# Patient Record
Sex: Male | Born: 1995 | Race: Black or African American | Hispanic: No | Marital: Single | Smoking: Current some day smoker
Health system: Southern US, Community
[De-identification: ages and names within clinical notes are randomized; demographics above are authoritative.]

## PROBLEM LIST (undated history)

## (undated) ENCOUNTER — Ambulatory Visit (HOSPITAL_COMMUNITY): Admission: EM | Payer: Self-pay | Source: Home / Self Care

## (undated) DIAGNOSIS — H539 Unspecified visual disturbance: Secondary | ICD-10-CM

## (undated) DIAGNOSIS — J45909 Unspecified asthma, uncomplicated: Secondary | ICD-10-CM

## (undated) DIAGNOSIS — F909 Attention-deficit hyperactivity disorder, unspecified type: Secondary | ICD-10-CM

## (undated) DIAGNOSIS — K589 Irritable bowel syndrome without diarrhea: Secondary | ICD-10-CM

## (undated) DIAGNOSIS — F4310 Post-traumatic stress disorder, unspecified: Secondary | ICD-10-CM

## (undated) DIAGNOSIS — Z7289 Other problems related to lifestyle: Secondary | ICD-10-CM

## (undated) DIAGNOSIS — F419 Anxiety disorder, unspecified: Secondary | ICD-10-CM

## (undated) DIAGNOSIS — F32A Depression, unspecified: Secondary | ICD-10-CM

## (undated) DIAGNOSIS — T1491XA Suicide attempt, initial encounter: Secondary | ICD-10-CM

## (undated) DIAGNOSIS — K5900 Constipation, unspecified: Secondary | ICD-10-CM

## (undated) DIAGNOSIS — F329 Major depressive disorder, single episode, unspecified: Secondary | ICD-10-CM

## (undated) HISTORY — DX: Constipation, unspecified: K59.00

## (undated) HISTORY — DX: Irritable bowel syndrome, unspecified: K58.9

---
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
## 2018-08-30 ENCOUNTER — Encounter (HOSPITAL_COMMUNITY): Payer: Self-pay | Admitting: Emergency Medicine

## 2018-08-30 ENCOUNTER — Ambulatory Visit (INDEPENDENT_AMBULATORY_CARE_PROVIDER_SITE_OTHER): Payer: Medicaid Other

## 2018-08-30 ENCOUNTER — Ambulatory Visit (HOSPITAL_COMMUNITY)
Admission: EM | Admit: 2018-08-30 | Discharge: 2018-08-30 | Disposition: A | Discharge status: Home / Self Care | Payer: Medicaid Other | Attending: Family Medicine | Admitting: Family Medicine

## 2018-08-30 DIAGNOSIS — Z79899 Other long term (current) drug therapy: Secondary | ICD-10-CM | POA: Diagnosis not present

## 2018-08-30 DIAGNOSIS — N4889 Other specified disorders of penis: Secondary | ICD-10-CM | POA: Diagnosis not present

## 2018-08-30 DIAGNOSIS — F909 Attention-deficit hyperactivity disorder, unspecified type: Secondary | ICD-10-CM | POA: Insufficient documentation

## 2018-08-30 DIAGNOSIS — R0789 Other chest pain: Secondary | ICD-10-CM

## 2018-08-30 DIAGNOSIS — S060X0A Concussion without loss of consciousness, initial encounter: Secondary | ICD-10-CM

## 2018-08-30 DIAGNOSIS — W19XXXA Unspecified fall, initial encounter: Secondary | ICD-10-CM | POA: Insufficient documentation

## 2018-08-30 DIAGNOSIS — F4310 Post-traumatic stress disorder, unspecified: Secondary | ICD-10-CM | POA: Diagnosis not present

## 2018-08-30 DIAGNOSIS — R51 Headache: Secondary | ICD-10-CM | POA: Diagnosis present

## 2018-08-30 LAB — POCT URINALYSIS DIP (DEVICE)
BILIRUBIN URINE: NEGATIVE
GLUCOSE, UA: NEGATIVE mg/dL
HGB URINE DIPSTICK: NEGATIVE
Ketones, ur: NEGATIVE mg/dL
LEUKOCYTES UA: NEGATIVE
NITRITE: NEGATIVE
Protein, ur: NEGATIVE mg/dL
Specific Gravity, Urine: 1.02 (ref 1.005–1.030)
UROBILINOGEN UA: 0.2 mg/dL (ref 0.0–1.0)
pH: 7 (ref 5.0–8.0)

## 2018-08-30 NOTE — Discharge Instructions (Signed)
Chest xray was negative. Your urine did not have blood in it. STD testing sent, you will be contacted with any positive results. You can take tylenol/motrin for pain/headache. Follow up with concussion clinic if symptoms not improving. If experiencing worsening of symptoms, headache/blurry vision, nausea/vomiting, confusion/altered mental status, dizziness, weakness, passing out, imbalance, go to the emergency department for further evaluation.

## 2018-08-30 NOTE — ED Notes (Signed)
Patient cannot void at this time 

## 2018-08-30 NOTE — ED Notes (Signed)
Urine placed in lab 
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
## 2018-08-31 LAB — URINE CYTOLOGY ANCILLARY ONLY
CHLAMYDIA, DNA PROBE: NEGATIVE
Neisseria Gonorrhea: NEGATIVE
Trichomonas: NEGATIVE

## 2018-10-11 ENCOUNTER — Ambulatory Visit (HOSPITAL_COMMUNITY)
Admission: EM | Admit: 2018-10-11 | Discharge: 2018-10-11 | Discharge status: Absent without leave | Payer: Medicaid Other

## 2018-11-01 ENCOUNTER — Ambulatory Visit (HOSPITAL_COMMUNITY)
Admission: EM | Admit: 2018-11-01 | Discharge: 2018-11-01 | Disposition: A | Discharge status: Home / Self Care | Payer: Medicaid Other | Attending: Family Medicine | Admitting: Family Medicine

## 2018-11-01 ENCOUNTER — Encounter (HOSPITAL_COMMUNITY): Payer: Self-pay

## 2018-11-01 DIAGNOSIS — F332 Major depressive disorder, recurrent severe without psychotic features: Secondary | ICD-10-CM | POA: Diagnosis not present

## 2018-11-01 DIAGNOSIS — K581 Irritable bowel syndrome with constipation: Secondary | ICD-10-CM | POA: Insufficient documentation

## 2018-11-01 DIAGNOSIS — Z7982 Long term (current) use of aspirin: Secondary | ICD-10-CM | POA: Insufficient documentation

## 2018-11-01 DIAGNOSIS — J45909 Unspecified asthma, uncomplicated: Secondary | ICD-10-CM | POA: Diagnosis not present

## 2018-11-01 DIAGNOSIS — Z79899 Other long term (current) drug therapy: Secondary | ICD-10-CM | POA: Insufficient documentation

## 2018-11-01 DIAGNOSIS — N4889 Other specified disorders of penis: Secondary | ICD-10-CM | POA: Insufficient documentation

## 2018-11-01 DIAGNOSIS — F4310 Post-traumatic stress disorder, unspecified: Secondary | ICD-10-CM | POA: Insufficient documentation

## 2018-11-01 LAB — POCT URINALYSIS DIP (DEVICE)
Bilirubin Urine: NEGATIVE
Glucose, UA: NEGATIVE mg/dL
HGB URINE DIPSTICK: NEGATIVE
KETONES UR: NEGATIVE mg/dL
LEUKOCYTES UA: NEGATIVE
Nitrite: NEGATIVE
Protein, ur: NEGATIVE mg/dL
SPECIFIC GRAVITY, URINE: 1.02 (ref 1.005–1.030)
UROBILINOGEN UA: 0.2 mg/dL (ref 0.0–1.0)
pH: 6.5 (ref 5.0–8.0)

## 2018-11-01 LAB — RAPID HIV SCREEN (HIV 1/2 AB+AG)
HIV 1/2 ANTIBODIES: NONREACTIVE
HIV-1 P24 ANTIGEN - HIV24: NONREACTIVE

## 2018-11-01 NOTE — Discharge Instructions (Signed)
We are testing you today for STDs We will call you with any positive results If your test results come back negative I would like for you to follow-up with urology for further evaluation.

## 2018-11-01 NOTE — ED Triage Notes (Signed)
Pt presents with rash on penis area, pain and discomfort.
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
## 2018-11-02 LAB — RPR: RPR Ser Ql: NONREACTIVE

## 2018-11-02 LAB — URINE CYTOLOGY ANCILLARY ONLY
CHLAMYDIA, DNA PROBE: NEGATIVE
Neisseria Gonorrhea: NEGATIVE
Trichomonas: NEGATIVE

## 2018-11-02 LAB — HSV(HERPES SIMPLEX VRS) I + II AB-IGM: HSVI/II Comb IgM: <0.91 Ratio (ref 0.00–0.90)

## 2019-01-29 ENCOUNTER — Emergency Department (HOSPITAL_COMMUNITY): Payer: Medicaid Other

## 2019-01-29 ENCOUNTER — Emergency Department (HOSPITAL_COMMUNITY)
Admission: EM | Admit: 2019-01-29 | Discharge: 2019-01-29 | Disposition: A | Discharge status: Home / Self Care | Payer: Medicaid Other | Attending: Emergency Medicine | Admitting: Emergency Medicine

## 2019-01-29 ENCOUNTER — Encounter (HOSPITAL_COMMUNITY): Payer: Self-pay

## 2019-01-29 DIAGNOSIS — J45909 Unspecified asthma, uncomplicated: Secondary | ICD-10-CM | POA: Insufficient documentation

## 2019-01-29 DIAGNOSIS — F909 Attention-deficit hyperactivity disorder, unspecified type: Secondary | ICD-10-CM | POA: Diagnosis not present

## 2019-01-29 DIAGNOSIS — Z7982 Long term (current) use of aspirin: Secondary | ICD-10-CM | POA: Insufficient documentation

## 2019-01-29 DIAGNOSIS — R0789 Other chest pain: Secondary | ICD-10-CM | POA: Diagnosis not present

## 2019-01-29 DIAGNOSIS — Z79899 Other long term (current) drug therapy: Secondary | ICD-10-CM | POA: Diagnosis not present

## 2019-01-29 LAB — POCT I-STAT EG7
ACID-BASE EXCESS: 1 mmol/L (ref 0.0–2.0)
Bicarbonate: 27.7 mmol/L (ref 20.0–28.0)
CALCIUM ION: 1.25 mmol/L (ref 1.15–1.40)
HCT: 44 % (ref 39.0–52.0)
HEMOGLOBIN: 15 g/dL (ref 13.0–17.0)
O2 SAT: 66 %
PH VEN: 7.357 (ref 7.250–7.430)
POTASSIUM: 3.9 mmol/L (ref 3.5–5.1)
SODIUM: 141 mmol/L (ref 135–145)
TCO2: 29 mmol/L (ref 22–32)
pCO2, Ven: 49.5 mmHg (ref 44.0–60.0)
pO2, Ven: 36 mmHg (ref 32.0–45.0)

## 2019-01-29 LAB — CBG MONITORING, ED: Glucose-Capillary: 85 mg/dL (ref 70–99)

## 2019-01-29 LAB — I-STAT CREATININE, ED: Creatinine, Ser: 1.3 mg/dL — ABNORMAL HIGH (ref 0.61–1.24)

## 2019-01-29 MED ORDER — IBUPROFEN 800 MG PO TABS
800.0000 mg | ORAL_TABLET | Freq: Once | ORAL | Status: AC
  Administered 2019-01-29: 800 mg via ORAL
  Filled 2019-01-29: qty 1

## 2019-01-29 NOTE — ED Provider Notes (Signed)
Care assumed from Dr. Rosalia Hammers at shift change, please see her note for full details but in brief [name] is a 23 y.o. male presents to the emergency department for evaluation of 1 week of intermittent chest pains they are sharp and located over the anterior chest they last a few seconds and are reproducible on exam.  EKG without concerning changes from previous care team and patient with normal vitals appears to be in no acute distress with clear lungs.  At shift change chest x-ray and basic lab work pending, if this is normal patient can go home with symptomatic treatment for chest wall pain.  Plan: Follow-up on chest x-ray and i-STAT labs to ensure patient has a normal hemoglobin, if no acute findings patient can be discharged home with symptomatic treatment for chest wall pain and PCP follow-up.  Labs Reviewed  I-STAT CREATININE, ED - Abnormal; Notable for the following components:      Result Value   Creatinine, Ser 1.30 (*)    All other components within normal limits  CBG MONITORING, ED  POCT I-STAT EG7   Dg Chest 2 View  Result Date: 01/29/2019 CLINICAL DATA:  Chest pain for a month. EXAM: CHEST - 2 VIEW COMPARISON:  August 30, 2018 FINDINGS: The heart size and mediastinal contours are within normal limits. Both lungs are clear. The visualized skeletal structures are unremarkable. IMPRESSION: No active cardiopulmonary disease. Electronically Signed   By: Gerome Sam III M.D   On: 01/29/2019 18:56   MDM  Chest x-ray shows no evidence of active cardiopulmonary disease.  EKG with normal sinus rhythm.  Labs show a normal hemoglobin, no other acute electrolyte derangements, patient's creatinine is slightly elevated at 1.3, has been around the same previously, will have patient increase p.o. fluid intake and follow-up with his primary care doctor in 1 week for recheck, I have counseled for him to avoid NSAIDs and take Tylenol as needed for chest wall pain.  Return precautions  discussed.  Patient expresses understanding and agreement with plan.  Discharged home in good condition.  Final diagnoses:  Atypical chest pain      Legrand Rams 01/29/19 1954    Margarita Grizzle, MD 02/02/19 [phone]

## 2019-01-29 NOTE — ED Triage Notes (Signed)
Pt arrives to ED from home with complaints of chest pain x1 month, worse with ambulation. EMS reports pt regularly smokes marijuana. Pt placed in position of comfort with bed locked and lowered, call bell in reach.

## 2019-01-29 NOTE — ED Provider Notes (Signed)
[name] EMERGENCY DEPARTMENT Provider Note   CSN: [id number] Arrival date & time: 01/29/19  1829     History   Chief Complaint Chief Complaint  Patient presents with  . Chest Pain    HPI [name] [name] is a 23 y.o. male.  HPI  23 yo male presents via EMS with complaints of intermittent chest pain for 1 week.  Pain is moderate to severe and occurs in the left anterior chest area.  Sharp in nature and lasts minutes to seconds.  He is unable to tell me anything specifically that makes it worse or eases it off.  He has not taken any medications.  He denies any previous evaluation for this.  He states that he has had some cough and intermittent dyspnea but is not currently dyspneic.  He denies any lateralized leg swelling, history of DVT or PE.  He has not had fever or chills or productive cough.  Denies any nausea, vomiting, upper abdominal pain.  Past Medical History:  Diagnosis Date  . ADHD (attention deficit hyperactivity disorder)   . Anxiety   . Asthma   . Constipation   . Deliberate self-cutting   . Depression   . IBS (irritable bowel syndrome)   . Irritable bowel syndrome   . Post traumatic stress disorder (PTSD)   . Suicide attempt (HCC)   . Vision abnormalities    Pt states he wears glasses    Patient Active Problem List   Diagnosis Date Noted  . Severe episode of recurrent major depressive disorder, without psychotic features (HCC)   . Major depression 04/06/2016  . MDD (major depressive disorder), recurrent episode, severe (HCC) 11/14/2015  . Suicidal ideation 09/27/2015  . Substance abuse (HCC) 09/27/2015  . MDD (major depressive disorder), recurrent severe, without psychosis (HCC) 02/01/2015  . PTSD (post-traumatic stress disorder) 11/28/2013  . ADHD (attention deficit hyperactivity disorder), combined type 10/16/2013  . ODD (oppositional defiant disorder) 10/16/2013  . Chronic constipation     History reviewed. No pertinent  surgical history.      Home Medications    Prior to Admission medications   Medication Sig Start Date End Date Taking? Authorizing Provider  amphetamine-dextroamphetamine (ADDERALL XR) 30 MG 24 hr capsule Take 1 capsule (30 mg total) by mouth daily. 05/21/16   Thedora Hinders, MD  Aspirin-Acetaminophen-Caffeine (EXCEDRIN EXTRA STRENGTH PO) Take 2 tablets by mouth daily as needed (headache).    [provider]  famotidine (PEPCID) 20 MG tablet Take 20 mg by mouth daily. 03/26/16   [provider]  hydrOXYzine (ATARAX/VISTARIL) 50 MG tablet Take 1 tablet (50 mg total) by mouth at bedtime. 05/21/16   Thedora Hinders, MD  lithium carbonate (ESKALITH) 450 MG CR tablet Take 2 tablets (900 mg total) by mouth every 12 (twelve) hours. 05/21/16   Thedora Hinders, MD  polyethylene glycol powder (GLYCOLAX/MIRALAX) powder Take 13.5 g by mouth daily. 13.5 gram = 3/4 capful 05/02/13 05/02/14  Jon Gills, MD  Psyllium 30.9 % POWD Take 1 scoop by mouth 2 (two) times daily. 03/29/13   Jon Gills, MD  saccharomyces boulardii (FLORASTOR) 250 MG capsule Take 1 capsule (250 mg total) by mouth 2 (two) times daily. 05/21/16   Thedora Hinders, MD  sertraline (ZOLOFT) 100 MG tablet Take 1 tablet (100 mg total) by mouth daily. 05/21/16   Thedora Hinders, MD    Family History Family History  Problem Relation Age of Onset  . ADD / ADHD  Brother   . Hirschsprung's disease Neg Hx     Social History Social History   Tobacco Use  . Smoking status: Never Smoker  Substance Use Topics  . Alcohol use: Yes    Comment: Pt states he drinks on occassion  . Drug use: No    Types: Oxycodone    Comment: last used in 7th grade when he "tried it"/used oxycodone last 2015     Allergies   Patient has no known allergies.   Review of Systems Review of Systems  All other systems reviewed and are negative.    Physical Exam Updated Vital  Signs Ht 1.651 m (5\' 5" )   Wt 93 kg   BMI 34.12 kg/m   Physical Exam Vitals signs and nursing note reviewed.  Constitutional:      Appearance: He is well-developed.  HENT:     Head: Normocephalic and atraumatic.     Right Ear: External ear normal.     Left Ear: External ear normal.     Nose: Nose normal.  Eyes:     Pupils: Pupils are equal, round, and reactive to light.  Neck:     Musculoskeletal: Normal range of motion.     Trachea: No tracheal deviation.  Cardiovascular:     Rate and Rhythm: Normal rate and regular rhythm.     Heart sounds: Normal heart sounds.  Pulmonary:     Effort: Pulmonary effort is normal.  Chest:     Chest wall: No mass or deformity.    Musculoskeletal: Normal range of motion.  Skin:    General: Skin is warm and dry.  Neurological:     Mental Status: He is alert and oriented to person, place, and time.  Psychiatric:        Behavior: Behavior normal.      ED Treatments / Results  Labs (all labs ordered are listed, but only abnormal results are displayed) Labs Reviewed  I-STAT CREATININE, ED  CBG MONITORING, ED    EKG EKG Interpretation  Date/Time:  Monday January 29 2019 18:29:38 EST Ventricular Rate:  82 PR Interval:  140 QRS Duration: 90 QT Interval:  332 QTC Calculation: 387 R Axis:   78 Text Interpretation:  Normal sinus rhythm Normal ECG Confirmed by Margarita Grizzle [phone]) on 01/29/2019 6:39:15 PM   Radiology No results found.  Procedures Procedures (including critical care time)  Medications Ordered in ED Medications - No data to display   Initial Impression / Assessment and Plan / ED Course  I have reviewed the triage vital signs and the nursing notes.  Pertinent labs & imaging results that were available during my care of the patient were reviewed by me and considered in my medical decision making (see chart for details).     23 yo male with atypical chest pain Low index suspicion for pe, perc negative Low  index suspicion for cad, acute infection, intraabdominal process Will check cxr and plan d/c if normal with tx for chest wall pain  Final Clinical Impressions(s) / ED Diagnoses   Final diagnoses:  Atypical chest pain    ED Discharge Orders    None       Margarita Grizzle, MD 01/29/19 1900

## 2019-01-29 NOTE — Discharge Instructions (Addendum)
Evaluation today is very reassuring, chest x-ray and EKG look good, your kidney function is slightly elevated please start drinking more fluids regularly and please have this rechecked with your regular doctor in 1 week.  Please avoid NSAIDs like ibuprofen, Tylenol or aspirin.  You may take Tylenol as needed for pain and also use ice, heat or over-the-counter muscle rubs or lidocaine patches.  Return to the emergency department if you have significantly worsened chest pain, shortness of breath, lightheadedness feel like you are going to pass out or any other new or concerning symptoms.

## 2021-01-18 IMAGING — CR DG CHEST 2V
2 series · 2 of 2 positions shown · non-contrast
Comparison: August 30, 2018

CLINICAL DATA: Chest pain for a month.

EXAM:
CHEST - 2 VIEW

[chest pa]
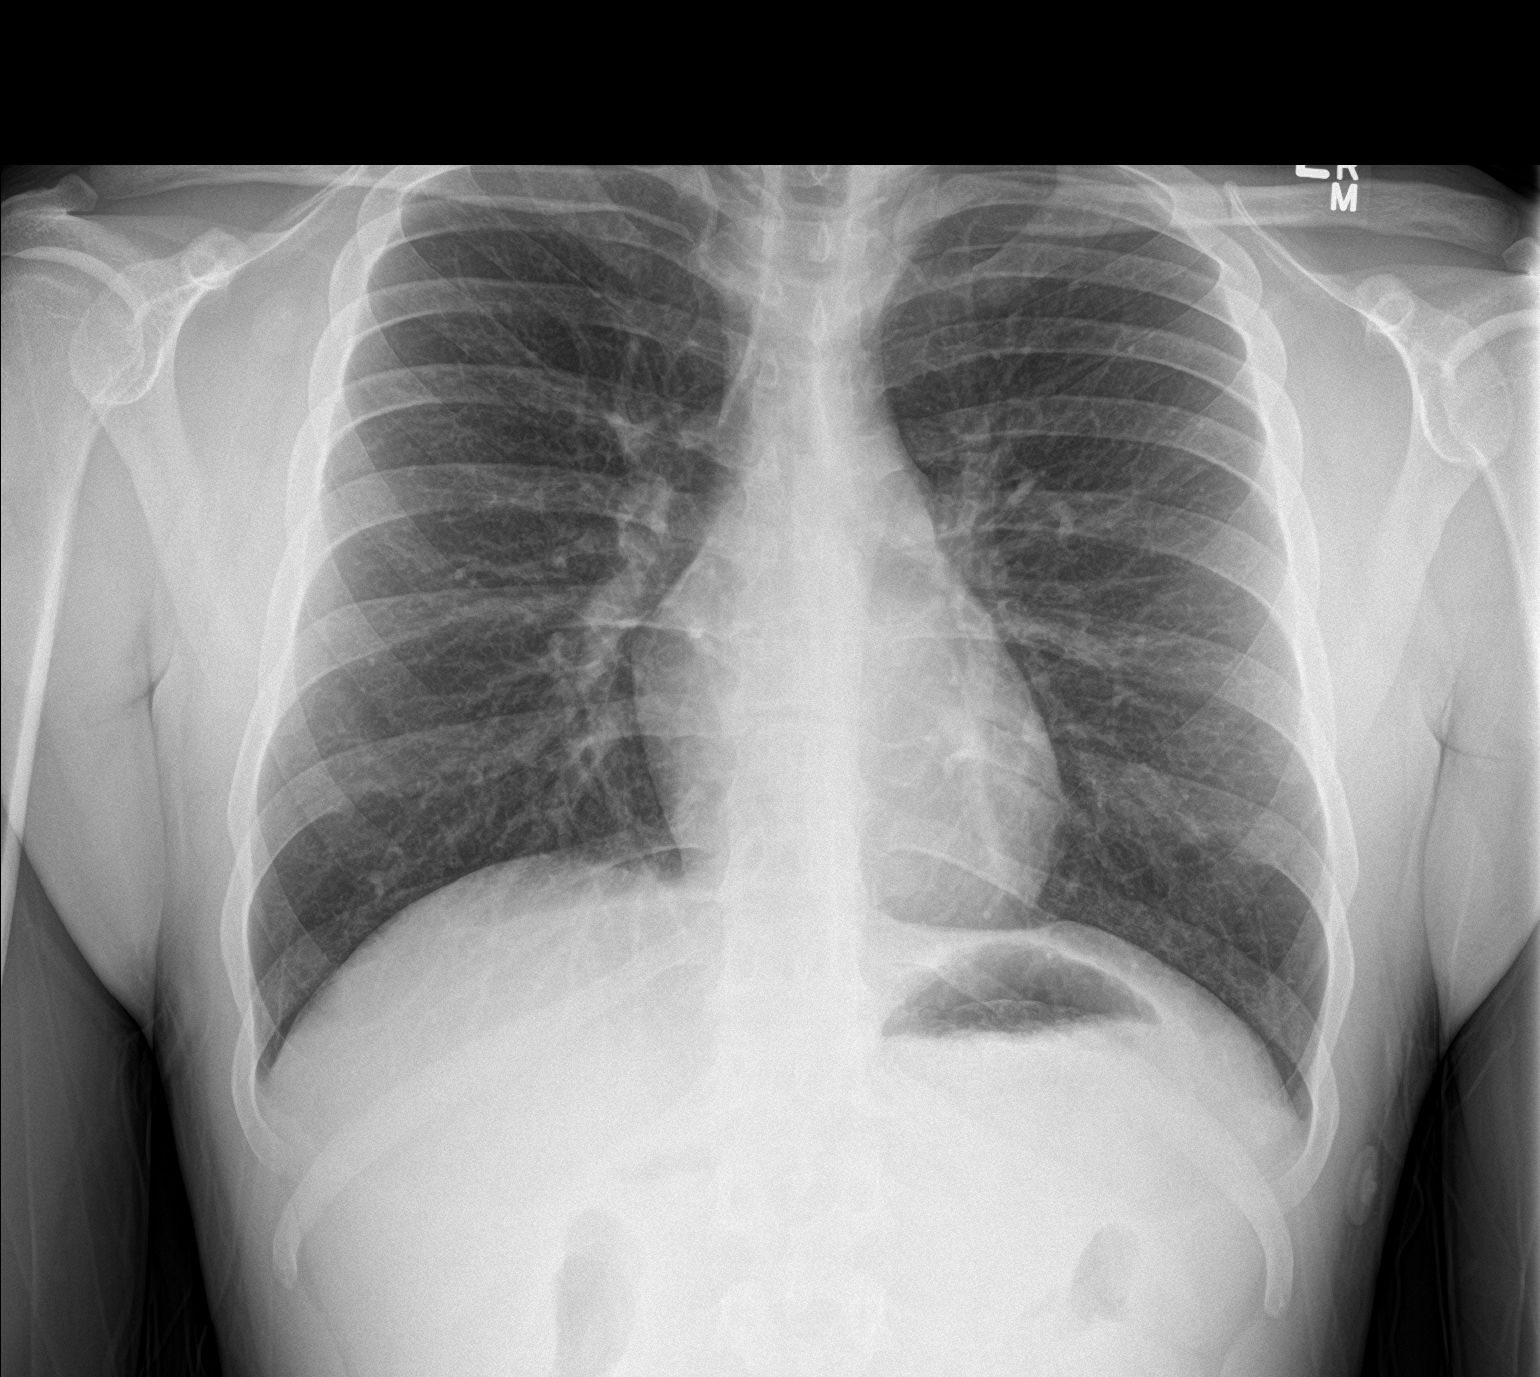

[chest lat]
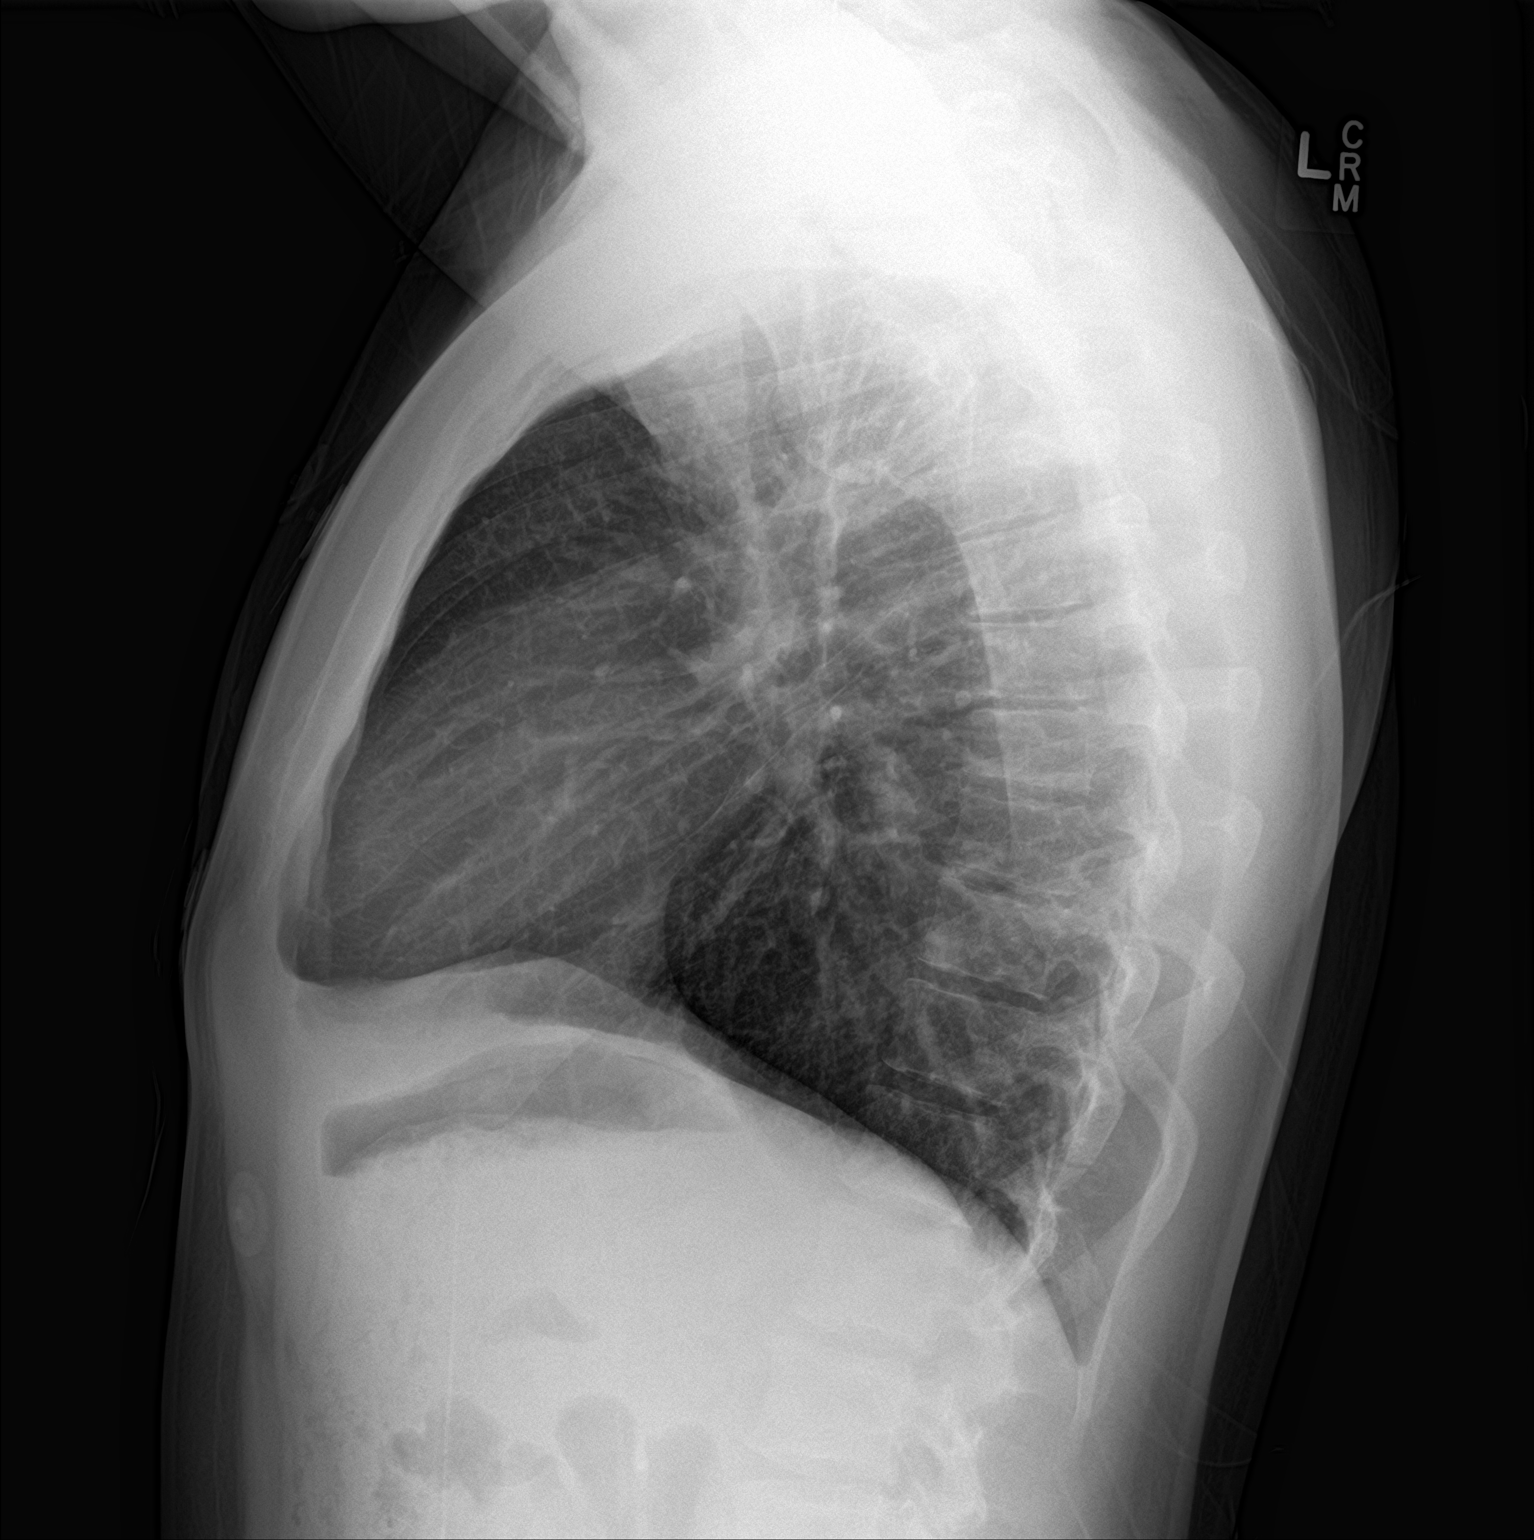

[2 of 2 positions shown; findings below may reference images not displayed]

FINDINGS: The heart size and mediastinal contours are within normal limits.
Both lungs are clear. The visualized skeletal structures are
unremarkable.
IMPRESSION: No active cardiopulmonary disease.

## 2021-02-17 ENCOUNTER — Ambulatory Visit: Payer: Medicaid Other | Attending: Urology | Admitting: Physical Therapy

## 2021-04-09 ENCOUNTER — Ambulatory Visit: Payer: Medicaid Other | Attending: Urology | Admitting: Physical Therapy

## 2021-10-05 ENCOUNTER — Inpatient Hospital Stay (HOSPITAL_COMMUNITY)
Admission: RE | Admit: 2021-10-05 | Discharge: 2021-10-11 | DRG: 885 | Disposition: A | Discharge status: Home / Self Care | Payer: Medicaid Other | Attending: Family | Admitting: Family

## 2021-10-05 ENCOUNTER — Encounter (HOSPITAL_COMMUNITY): Payer: Self-pay | Admitting: Psychiatry

## 2021-10-05 ENCOUNTER — Other Ambulatory Visit: Payer: Self-pay

## 2021-10-05 DIAGNOSIS — F1910 Other psychoactive substance abuse, uncomplicated: Secondary | ICD-10-CM | POA: Diagnosis not present

## 2021-10-05 DIAGNOSIS — Z818 Family history of other mental and behavioral disorders: Secondary | ICD-10-CM

## 2021-10-05 DIAGNOSIS — E559 Vitamin D deficiency, unspecified: Secondary | ICD-10-CM | POA: Diagnosis present

## 2021-10-05 DIAGNOSIS — F17210 Nicotine dependence, cigarettes, uncomplicated: Secondary | ICD-10-CM | POA: Diagnosis present

## 2021-10-05 DIAGNOSIS — F603 Borderline personality disorder: Secondary | ICD-10-CM | POA: Diagnosis present

## 2021-10-05 DIAGNOSIS — Z62810 Personal history of physical and sexual abuse in childhood: Secondary | ICD-10-CM | POA: Diagnosis present

## 2021-10-05 DIAGNOSIS — R45851 Suicidal ideations: Secondary | ICD-10-CM | POA: Diagnosis present

## 2021-10-05 DIAGNOSIS — R350 Frequency of micturition: Secondary | ICD-10-CM | POA: Diagnosis present

## 2021-10-05 DIAGNOSIS — F4310 Post-traumatic stress disorder, unspecified: Secondary | ICD-10-CM | POA: Diagnosis not present

## 2021-10-05 DIAGNOSIS — F17200 Nicotine dependence, unspecified, uncomplicated: Secondary | ICD-10-CM

## 2021-10-05 DIAGNOSIS — F332 Major depressive disorder, recurrent severe without psychotic features: Principal | ICD-10-CM | POA: Diagnosis present

## 2021-10-05 DIAGNOSIS — G4700 Insomnia, unspecified: Secondary | ICD-10-CM | POA: Diagnosis present

## 2021-10-05 DIAGNOSIS — Z20822 Contact with and (suspected) exposure to covid-19: Secondary | ICD-10-CM | POA: Diagnosis present

## 2021-10-05 DIAGNOSIS — Z9152 Personal history of nonsuicidal self-harm: Secondary | ICD-10-CM

## 2021-10-05 DIAGNOSIS — K589 Irritable bowel syndrome without diarrhea: Secondary | ICD-10-CM | POA: Diagnosis present

## 2021-10-05 DIAGNOSIS — F902 Attention-deficit hyperactivity disorder, combined type: Secondary | ICD-10-CM | POA: Diagnosis not present

## 2021-10-05 DIAGNOSIS — Z560 Unemployment, unspecified: Secondary | ICD-10-CM | POA: Diagnosis not present

## 2021-10-05 LAB — RESP PANEL BY RT-PCR (FLU A&B, COVID) ARPGX2
Influenza A by PCR: NEGATIVE
Influenza B by PCR: NEGATIVE
SARS Coronavirus 2 by RT PCR: NEGATIVE

## 2021-10-05 MED ORDER — HYDROXYZINE HCL 25 MG PO TABS
25.0000 mg | ORAL_TABLET | Freq: Three times a day (TID) | ORAL | Status: DC | PRN
  Administered 2021-10-05 – 2021-10-11 (×16): 25 mg via ORAL
  Filled 2021-10-05 (×17): qty 1

## 2021-10-05 MED ORDER — MAGNESIUM HYDROXIDE 400 MG/5ML PO SUSP
30.0000 mL | Freq: Every day | ORAL | Status: DC | PRN
Start: 2021-10-05 — End: 2021-10-11
  Administered 2021-10-06 – 2021-10-07 (×2): 30 mL via ORAL
  Filled 2021-10-05 (×2): qty 30

## 2021-10-05 MED ORDER — TRAZODONE HCL 50 MG PO TABS
50.0000 mg | ORAL_TABLET | Freq: Every evening | ORAL | Status: DC | PRN
  Filled 2021-10-05: qty 1

## 2021-10-05 MED ORDER — TRAZODONE HCL 50 MG PO TABS
50.0000 mg | ORAL_TABLET | Freq: Every evening | ORAL | Status: DC | PRN
  Administered 2021-10-05 – 2021-10-07 (×3): 50 mg via ORAL
  Filled 2021-10-05 (×2): qty 1

## 2021-10-05 MED ORDER — HYDROXYZINE HCL 25 MG PO TABS
25.0000 mg | ORAL_TABLET | Freq: Three times a day (TID) | ORAL | Status: DC | PRN
  Filled 2021-10-05: qty 1

## 2021-10-05 MED ORDER — ALUM & MAG HYDROXIDE-SIMETH 200-200-20 MG/5ML PO SUSP: 30.0000 mL | ORAL | Status: DC | PRN

## 2021-10-05 MED ORDER — ACETAMINOPHEN 325 MG PO TABS
650.0000 mg | ORAL_TABLET | Freq: Four times a day (QID) | ORAL | Status: DC | PRN
  Administered 2021-10-06 – 2021-10-09 (×2): 650 mg via ORAL
  Filled 2021-10-05 (×2): qty 2

## 2021-10-05 MED ORDER — MAGNESIUM HYDROXIDE 400 MG/5ML PO SUSP
30.0000 mL | Freq: Every day | ORAL | Status: DC | PRN
  Filled 2021-10-05: qty 30

## 2021-10-05 MED ORDER — ACETAMINOPHEN 325 MG PO TABS
650.0000 mg | ORAL_TABLET | Freq: Four times a day (QID) | ORAL | Status: DC | PRN
  Filled 2021-10-05: qty 2

## 2021-10-05 MED ORDER — HYDROXYZINE HCL 25 MG PO TABS
ORAL_TABLET | ORAL | Status: AC
  Filled 2021-10-05: qty 1

## 2021-10-05 MED ORDER — ALUM & MAG HYDROXIDE-SIMETH 200-200-20 MG/5ML PO SUSP
30.0000 mL | ORAL | Status: DC | PRN
  Filled 2021-10-05: qty 30

## 2021-10-05 MED ORDER — NICOTINE 14 MG/24HR TD PT24
14.0000 mg | MEDICATED_PATCH | Freq: Every day | TRANSDERMAL | Status: DC
  Administered 2021-10-06 – 2021-10-07 (×2): 14 mg via TRANSDERMAL
  Filled 2021-10-05 (×4): qty 1
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
## 2021-10-05 NOTE — Progress Notes (Signed)
[name] is a 25 year old male being admitted voluntarily to 402-1 as a walk in to [facility].  He presented to [facility] with ongoing suicidal ideation with plan to overdose on old medications at his apartment. He has history of multiple suicide attempts and psychiatric hospitalizations.  Recent stressors include losing his job, recent break up and not being on medications in over two years.  During [facility] admission, he was very pleasant.  Affect flat and sad.  He continues to voice suicidal ideation but does agree to not harm himself on the unit.  He denied HI or AVH.  He denied any pain or discomfort and appeared to be in no physical distress.  Oriented him to the unit.  Admission paperwork completed and signed.  Belongings searched and secured in locker # 38.  Skin assessment completed and noted multiple tattoos and scars on lower left forearm from self cutting.  No contraband found.  Suicide safety plan reviewed, given to patient to complete and return to his nurse.  Q 15 minute checks initiated for safety.  We will monitor the progress towards his goals.

## 2021-10-05 NOTE — Progress Notes (Signed)
Psychoeducational Group Note  Date:  10/05/2021 Time:  2159  Group Topic/Focus:  Wrap-Up Group:   The focus of this group is to help patients review their daily goal of treatment and discuss progress on daily workbooks.  Participation Level: Did Not Attend  Participation Quality:  Not Applicable  Affect:  Not Applicable  Cognitive:  Not Applicable  Insight:  Not Applicable  Engagement in Group: Not Applicable  Additional Comments:  The patient did not attend group since he was admitted after the group concluded.   [name] 10/05/2021, 9:59 PM

## 2021-10-05 NOTE — Tx Team (Signed)
Initial Treatment Plan 10/05/2021 11:47 PM [facility] UMP:[id number]    PATIENT STRESSORS: Financial difficulties   Marital or family conflict   Medication change or noncompliance   Occupational concerns     PATIENT STRENGTHS: Capable of independent living  General fund of knowledge  Motivation for treatment/growth  Physical Health    PATIENT IDENTIFIED PROBLEMS: Depression  Suicidal ideation  Anxiety     "I need help managing my depression and mood swings""  "Getting better at communicating"           DISCHARGE CRITERIA:  Improved stabilization in mood, thinking, and/or behavior Need for constant or close observation no longer present Reduction of life-threatening or endangering symptoms to within safe limits Verbal commitment to aftercare and medication compliance  PRELIMINARY DISCHARGE PLAN: Outpatient therapy Medication management  PATIENT/FAMILY INVOLVEMENT: This treatment plan has been presented to and reviewed with the patient, [facility].  The patient and family have been given the opportunity to ask questions and make suggestions.  Levin Bacon, RN 10/05/2021, 11:47 PM

## 2021-10-05 NOTE — H&P (Signed)
Behavioral Health Medical Screening Exam  [name] is an 25 y.o. male who presented to [facility], voluntary as a walk-in seeking help fro refractory depression. He stated for the past year it has been getting worse and worse. He has been off medications for 2 years. He was inpatient at [facility] in 2001 and from [facility] went to [facility] for 4 months for depression and multiple suicide attempts. He stated today he called his mother because he was going to take some of his ex-girlfriends old pills to kill himself. He stated he is hopeless, worthless, sleeping all day, fatigued, has crying spells and is isolating. He stated his family does not really talk to him much anymore because of all the trouble he was when he was younger. He appears sad, depressed with a blunt affect. He is willing to come into the hospital voluntarily for help. He stated the last medication he can remember is Jordan and he was seeing a psychiatrist at [facility] until 2 years ago when they decided he could stop taking his medications. He lives alone and is unemployed.  Patient will be admitted to 402-1 for crisis stabilization and medication management.   Total Time spent with patient: 20 minutes  Psychiatric Specialty Exam: Physical Exam Vitals reviewed.  Constitutional:      Appearance: Normal appearance.  HENT:     Head: Normocephalic.  Pulmonary:     Effort: Pulmonary effort is normal.  Musculoskeletal:        General: Normal range of motion.     Cervical back: Normal range of motion.  Neurological:     General: No focal deficit present.     Mental Status: He is alert and oriented to person, place, and time.  Psychiatric:        Attention and Perception: Attention normal.        Mood and Affect: Mood is depressed. Affect is blunt and flat.        Speech: Speech normal.        Behavior: Behavior normal. Behavior is cooperative.        Thought Content: Thought content is not paranoid or delusional. Thought content includes suicidal  ideation. Thought content does not include homicidal ideation. Thought content includes suicidal plan. Thought content does not include homicidal plan.        Cognition and Memory: Cognition normal.   Review of Systems  Constitutional:  Positive for fatigue.  HENT:  Negative for congestion and sore throat.   Respiratory:  Negative for shortness of breath.   Cardiovascular: Negative.   Genitourinary: Negative.   Neurological: Negative.   Psychiatric/Behavioral:  Positive for dysphoric mood, sleep disturbance and suicidal ideas.   Blood pressure 135/90, pulse 91, temperature 99.6 F (37.6 C), temperature source Oral, resp. rate 18, SpO2 100 %.There is no height or weight on file to calculate BMI. General Appearance: Casual and Fairly Groomed Eye Contact:  Fair Speech:  Clear and Coherent and Normal Rate Volume:  Decreased Mood:  Depressed, Dysphoric, Hopeless, and Worthless Affect:  Congruent, Constricted, and Depressed Thought Process:  Coherent and Goal Directed Orientation:  Full (Time, Place, and Person) Thought Content:  Logical and Hallucinations: None Suicidal Thoughts:  Yes.  with intent/plan Homicidal Thoughts:  No Memory:  Immediate;   Good Recent;   Good Remote;   Good Judgement:  Good Insight:  Good Psychomotor Activity:  Normal Concentration: Concentration: Good and Attention Span: Good Recall:  Good Fund of Knowledge:Good Language: Good Akathisia:  No Handed:  Right AIMS (if indicated):    Assets:  Communication Skills Desire for Improvement Financial Resources/Insurance Housing Physical Health Social Support Sleep:     Musculoskeletal: Strength & Muscle Tone: within normal limits Gait & Station: normal Patient leans: N/A  Blood pressure 135/90, pulse 91, temperature 99.6 F (37.6 C), temperature source Oral, resp. rate 18, SpO2 100 %.  Recommendations: Based on my evaluation the patient does not appear to have an emergency medical  condition.  Laveda Abbe, NP 10/05/2021, 6:15 PM

## 2021-10-06 DIAGNOSIS — F1910 Other psychoactive substance abuse, uncomplicated: Secondary | ICD-10-CM

## 2021-10-06 DIAGNOSIS — F17200 Nicotine dependence, unspecified, uncomplicated: Secondary | ICD-10-CM

## 2021-10-06 DIAGNOSIS — F4310 Post-traumatic stress disorder, unspecified: Secondary | ICD-10-CM

## 2021-10-06 DIAGNOSIS — F332 Major depressive disorder, recurrent severe without psychotic features: Secondary | ICD-10-CM | POA: Diagnosis not present

## 2021-10-06 DIAGNOSIS — R45851 Suicidal ideations: Secondary | ICD-10-CM

## 2021-10-06 DIAGNOSIS — F902 Attention-deficit hyperactivity disorder, combined type: Secondary | ICD-10-CM | POA: Diagnosis not present

## 2021-10-06 LAB — COMPREHENSIVE METABOLIC PANEL
ALT: 27 U/L (ref 0–44)
AST: 23 U/L (ref 15–41)
Albumin: 4.6 g/dL (ref 3.5–5.0)
Alkaline Phosphatase: 79 U/L (ref 38–126)
Anion gap: 7 (ref 5–15)
BUN: 18 mg/dL (ref 6–20)
CO2: 28 mmol/L (ref 22–32)
Calcium: 9.7 mg/dL (ref 8.9–10.3)
Chloride: 103 mmol/L (ref 98–111)
Creatinine, Ser: 0.89 mg/dL (ref 0.61–1.24)
GFR, Estimated: >60 mL/min (ref >60)
Glucose, Bld: 98 mg/dL (ref 70–99)
Potassium: 3.3 mmol/L — ABNORMAL LOW (ref 3.5–5.1)
Sodium: 138 mmol/L (ref 135–145)
Total Bilirubin: 0.7 mg/dL (ref 0.3–1.2)
Total Protein: 7.5 g/dL (ref 6.5–8.1)

## 2021-10-06 LAB — URINALYSIS, COMPLETE (UACMP) WITH MICROSCOPIC
Bilirubin Urine: NEGATIVE
Glucose, UA: NEGATIVE mg/dL
Hgb urine dipstick: NEGATIVE
Ketones, ur: NEGATIVE mg/dL
Leukocytes,Ua: NEGATIVE
Nitrite: NEGATIVE
Protein, ur: NEGATIVE mg/dL
Specific Gravity, Urine: 1.025 (ref 1.005–1.030)
pH: 5 (ref 5.0–8.0)

## 2021-10-06 LAB — CBC
HCT: 48.3 % (ref 39.0–52.0)
Hemoglobin: 16.8 g/dL (ref 13.0–17.0)
MCH: 31.5 pg (ref 26.0–34.0)
MCHC: 34.8 g/dL (ref 30.0–36.0)
MCV: 90.4 fL (ref 80.0–100.0)
Platelets: 207 K/uL (ref 150–400)
RBC: 5.34 MIL/uL (ref 4.22–5.81)
RDW: 12.6 % (ref 11.5–15.5)
WBC: 5.9 K/uL (ref 4.0–10.5)
nRBC: 0 % (ref 0.0–0.2)

## 2021-10-06 LAB — LIPID PANEL
Cholesterol: 162 mg/dL (ref 0–200)
HDL: 50 mg/dL
LDL Cholesterol: 89 mg/dL (ref 0–99)
Total CHOL/HDL Ratio: 3.2 ratio
Triglycerides: 115 mg/dL
VLDL: 23 mg/dL (ref 0–40)

## 2021-10-06 LAB — HEMOGLOBIN A1C
Hgb A1c MFr Bld: 4.9 % (ref 4.8–5.6)
Mean Plasma Glucose: 93.93 mg/dL

## 2021-10-06 LAB — TSH: TSH: 0.891 u[IU]/mL (ref 0.350–4.500)

## 2021-10-06 LAB — ETHANOL: Alcohol, Ethyl (B): <10 mg/dL (ref <10)

## 2021-10-06 MED ORDER — TRAZODONE HCL 50 MG PO TABS
50.0000 mg | ORAL_TABLET | Freq: Once | ORAL | Status: AC
  Administered 2021-10-06: 50 mg via ORAL
  Filled 2021-10-06 (×2): qty 1

## 2021-10-06 MED ORDER — VENLAFAXINE HCL ER 75 MG PO CP24
75.0000 mg | ORAL_CAPSULE | Freq: Every day | ORAL | Status: DC
  Administered 2021-10-07 – 2021-10-11 (×5): 75 mg via ORAL
  Filled 2021-10-06 (×7): qty 1

## 2021-10-06 MED ORDER — POTASSIUM CHLORIDE CRYS ER 20 MEQ PO TBCR
40.0000 meq | EXTENDED_RELEASE_TABLET | Freq: Once | ORAL | Status: AC
  Administered 2021-10-06: 40 meq via ORAL
  Filled 2021-10-06: qty 2

## 2021-10-06 MED ORDER — VENLAFAXINE HCL ER 37.5 MG PO CP24
37.5000 mg | ORAL_CAPSULE | Freq: Once | ORAL | Status: AC
  Administered 2021-10-06: 37.5 mg via ORAL
  Filled 2021-10-06 (×2): qty 1

## 2021-10-06 NOTE — [facility] Group Notes (Signed)
[facility] Group Notes:  (Nursing/MHT/Case Management/Adjunct)  Date:  10/06/2021  Time:  5:28 PM  Type of Therapy:  Psychoeducational Skills  Participation Level:  Active  Participation Quality:  Appropriate  Affect:  Appropriate  Cognitive:  Appropriate  Insight:  Appropriate  Engagement in Group:  Engaged  Modes of Intervention:  Activity and Discussion  Summary of Progress/Problems:Pt attended group and participated in discussion.   Alecia Doi R Nester Bachus 10/06/2021, 5:28 PM

## 2021-10-06 NOTE — Progress Notes (Signed)
Psychoeducational Group Note  Date:  10/06/2021 Time:  2116  Group Topic/Focus:  Wrap-Up Group:   The focus of this group is to help patients review their daily goal of treatment and discuss progress on daily workbooks.  Participation Level: Did Not Attend  Participation Quality:  Not Applicable  Affect:  Not Applicable  Cognitive:  Not Applicable  Insight:  Not Applicable  Engagement in Group: Not Applicable  Additional Comments:  The patient did not attend group this evening.   [name] 10/06/2021, 9:17 PM

## 2021-10-06 NOTE — H&P (Addendum)
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 Principal Problem:   MDD (major depressive disorder), recurrent episode, severe (HCC) Active Problems:   ADHD (attention deficit hyperactivity disorder), combined type   PTSD (post-traumatic stress disorder)   Suicidal ideation   Substance abuse (HCC)  Long Term Goal(s): Improvement in symptoms so as ready for discharge  Short Term Goals: Ability to identify changes in lifestyle to reduce recurrence of condition will improve, Ability to verbalize feelings will improve,  Ability to disclose and discuss suicidal ideas, Ability to demonstrate self-control will improve, Ability to identify and develop effective coping behaviors will improve, Ability to maintain clinical measurements within normal limits will improve, Compliance with prescribed medications will improve, and Ability to identify triggers associated with substance abuse/mental health issues will improve  I certify that inpatient services furnished can reasonably be expected to improve the patient's condition.    Park Pope, MD, PGY 1 10/11/20225:58 PM   I have reviewed the note by Dr. Hazle Quant, and made amendments to the note.  I am in agreement with the assessment and plan.  I have independently interviewed and assessed the patient.   Mariel Craft, MD   Park Pope, MD 10/11/20225:58 PM

## 2021-10-06 NOTE — Group Note (Signed)
Recreation Therapy Group Note   Group Topic:Animal Assisted Therapy   Group Date: 10/06/2021 Start Time: 1430 End Time: 1515 Facilitators: Caroll Rancher, LRT/CTRS Location: [address]  AAA[facility]T Program Assumption of Risk Form signed by Patient/ or Parent Legal Guardian Yes  Patient is free of allergies or severe asthma Yes  Patient reports no fear of animals Yes  Patient reports no history of cruelty to animals Yes  Patient understands his/her participation is voluntary Yes  Patient washes hands before animal contact Yes  Patient washes hands after animal contact Yes   Affect/Mood: Appropriate   Participation Level: Engaged   Participation Quality: Independent   Behavior: Appropriate   Speech/Thought Process: Focused   Insight: Good   Judgement: Good   Modes of Intervention: Teaching laboratory technician   Patient Response to Interventions:  Engaged   Education Outcome:  Acknowledges education and In group clarification offered    Clinical Observations/Individualized Feedback: Patient attended session and interacted appropriately with therapy dog and peers. Patient asked appropriate questions about therapy dog and his training. Patient shared stories about their pets at home with group.     Plan: Continue to engage patient in RT group sessions 2-3x/week.   Caroll Rancher, LRT/CTRS 10/06/2021 4:26 PM

## 2021-10-06 NOTE — [facility] Group Notes (Signed)
[facility] Group Notes:  (Nursing/MHT/Case Management/Adjunct)  Date:  10/06/2021  Time:  9:30 am  Type of Therapy:  Group Therapy  Participation Level:  Active  Participation Quality:  Appropriate  Affect:  Appropriate  Cognitive:  Appropriate  Insight:  Appropriate  Engagement in Group:  Engaged  Modes of Intervention:  Discussion  Summary of Progress/Problems: Patient's goal today was to manage his depressive thoughts and use the coping skills  learned in groups. Patient was engaged in group.  [name] 10/06/2021, 10:21 AM

## 2021-10-06 NOTE — Progress Notes (Signed)
Pt visible on the unit this evening . Pt given PRN Trazodone, pt had to receive a 1 x 50 mg Trazodone after the first one due to pt not being able to go to sleep.     10/06/21 2200  Psych Admission Type (Psych Patients Only)  Admission Status Involuntary  Psychosocial Assessment  Patient Complaints Anxiety  Eye Contact Brief  Facial Expression Flat;Sad  Affect Sad;Flat  Speech Soft  Interaction Assertive  Motor Activity Other (Comment) (unremarkable)  Appearance/Hygiene Unremarkable  Behavior Characteristics Cooperative  Mood Depressed  Thought Process  Coherency WDL  Content WDL  Delusions WDL  Perception WDL  Hallucination None reported or observed  Judgment Poor  Confusion WDL  Danger to Self  Current suicidal ideation? Passive  Self-Injurious Behavior Some self-injurious ideation observed or expressed.  No lethal plan expressed   Agreement Not to Harm Self Yes  Description of Agreement Verbal agreement to not harm himself on the unit  Danger to Others  Danger to Others None reported or observed
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
## 2021-10-06 NOTE — Progress Notes (Signed)
Pt was provided a specimen cup for UDS/UA.
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
## 2021-10-07 ENCOUNTER — Encounter (HOSPITAL_COMMUNITY): Payer: Self-pay

## 2021-10-07 LAB — IRON AND TIBC
Iron: 131 ug/dL (ref 45–182)
Saturation Ratios: 40 % — ABNORMAL HIGH (ref 17.9–39.5)
TIBC: 327 ug/dL (ref 250–450)
UIBC: 196 ug/dL

## 2021-10-07 LAB — VITAMIN D 25 HYDROXY (VIT D DEFICIENCY, FRACTURES): Vit D, 25-Hydroxy: 23.36 ng/mL — ABNORMAL LOW (ref 30–100)

## 2021-10-07 LAB — FERRITIN: Ferritin: 88 ng/mL (ref 24–336)

## 2021-10-07 MED ORDER — TRAZODONE HCL 50 MG PO TABS
50.0000 mg | ORAL_TABLET | Freq: Once | ORAL | Status: AC
  Administered 2021-10-07: 50 mg via ORAL
  Filled 2021-10-07 (×2): qty 1

## 2021-10-07 MED ORDER — NICOTINE 21 MG/24HR TD PT24
21.0000 mg | MEDICATED_PATCH | Freq: Every day | TRANSDERMAL | Status: DC
  Administered 2021-10-08 – 2021-10-10 (×3): 21 mg via TRANSDERMAL
  Filled 2021-10-07 (×6): qty 1

## 2021-10-07 MED ORDER — POLYETHYLENE GLYCOL 3350 17 G PO PACK
17.0000 g | PACK | Freq: Once | ORAL | Status: DC
  Filled 2021-10-07 (×2): qty 1

## 2021-10-07 MED ORDER — RISAQUAD PO CAPS
2.0000 | ORAL_CAPSULE | Freq: Every day | ORAL | Status: DC
  Administered 2021-10-08 – 2021-10-11 (×4): 2 via ORAL
  Filled 2021-10-07 (×6): qty 2

## 2021-10-07 MED ORDER — POLYETHYLENE GLYCOL 3350 17 G PO PACK
17.0000 g | PACK | Freq: Every day | ORAL | Status: DC | PRN
  Administered 2021-10-08: 17 g via ORAL
  Filled 2021-10-07: qty 1

## 2021-10-07 MED ORDER — CALCIUM POLYCARBOPHIL 625 MG PO TABS
625.0000 mg | ORAL_TABLET | Freq: Two times a day (BID) | ORAL | Status: DC
  Administered 2021-10-08 – 2021-10-11 (×7): 625 mg via ORAL
  Filled 2021-10-07 (×12): qty 1

## 2021-10-07 MED ORDER — NUTRISOURCE FIBER PO PACK: 1.0000 | PACK | Freq: Two times a day (BID) | ORAL | Status: DC

## 2021-10-07 NOTE — Progress Notes (Addendum)
[facility] MD Progress Note  10/07/2021 11:28 AM [name]  MRN:  [id number] Subjective:  Patient is a 25 year old male with reported history of MDD, borderline personality disorder, anxiety, ADHD presents to [facility] as a walk-in for refractory depression.  Chart Review, 24 hr Events: The patient's chart was reviewed and nursing notes were reviewed. The patient's case was discussed in multidisciplinary team meeting.  Per MAR: - Patient is compliant with scheduled meds. - PRNs: trazodone x 2 Per RN notes, no documented behavioral issues and is attending group. Patient slept, 6 hours  TODAY'S INTERVIEW Patient seen and staffed with attending Dr. Viviano Simas.  Patient states that he is doing okay.  Patient still feels somewhat depressed and occasionally anxious at this time.  Patient states that he struggled to sleep last night and required 2 doses of trazodone.  Discussed with patient the possibility that the Effexor initial dose may have been given too late.  Patient agreeable to reassess tonight to see if he still is having problems sleeping.  Patient also endorsing mild constipation due to his IBS.  Patient states that he normally just takes an laxative if he knew requires it.  Patient also agreeable to taking a probiotic and fiber supplement which may assist with his bowel movements.  States that he has an additional nicotine cravings that is well sufficiently suppressed by present nicotine patch.  Patient endorses passive SI but denies HI/AVH.    Principal Problem: MDD (major depressive disorder), recurrent episode, severe (HCC) Diagnosis: Principal Problem:   MDD (major depressive disorder), recurrent episode, severe (HCC) Active Problems:   ADHD (attention deficit hyperactivity disorder), combined type   PTSD (post-traumatic stress disorder)   Suicidal ideation   Substance abuse (HCC)   Nicotine use disorder  Total Time spent with patient:  I personally spent 35 minutes on the unit  in direct patient care. The direct patient care time included face-to-face time with the patient, reviewing the patient's chart, communicating with other professionals, and coordinating care. Greater than 50% of this time was spent in counseling or coordinating care with the patient regarding goals of hospitalization, psycho-education, and discharge planning needs.   Past Psychiatric History: see H&P  Past Medical History:  Past Medical History:  Diagnosis Date   ADHD (attention deficit hyperactivity disorder)    Anxiety    Asthma    Constipation    Deliberate self-cutting    Depression    IBS (irritable bowel syndrome)    Irritable bowel syndrome    Post traumatic stress disorder (PTSD)    Suicide attempt (HCC)    Vision abnormalities    Pt states he wears glasses   History reviewed. No pertinent surgical history. Family History:  Family History  Problem Relation Age of Onset   ADD / ADHD Brother    Hirschsprung's disease Neg Hx    Family Psychiatric  History: see H&P Social History:  Social History   Substance and Sexual Activity  Alcohol Use Not Currently   Comment: Pt states he drinks on occassion     Social History   Substance and Sexual Activity  Drug Use No   Types: Oxycodone   Comment: last used in 7th grade when he "tried it"/used oxycodone last 2015    Social History   Socioeconomic History   Marital status: Single    Spouse name: Not on file   Number of children: Not on file   Years of education: Not on file   Highest education level: Not on  file  Occupational History   Not on file  Tobacco Use   Smoking status: Every Day    Packs/day: 1.50    Types: Cigarettes, Cigars   Smokeless tobacco: Never  Vaping Use   Vaping Use: Never used  Substance and Sexual Activity   Alcohol use: Not Currently    Comment: Pt states he drinks on occassion   Drug use: No    Types: Oxycodone    Comment: last used in 7th grade when he "tried it"/used oxycodone last  2015   Sexual activity: Never  Other Topics Concern   Not on file  Social History Narrative   ** Merged History Encounter **       9th grade         Social Determinants of Health   Financial Resource Strain: Not on file  Food Insecurity: Not on file  Transportation Needs: Not on file  Physical Activity: Not on file  Stress: Not on file  Social Connections: Not on file   Additional Social History:    Pain Medications: See MAR Prescriptions: SWee MAR Over the Counter: See MAR History of alcohol / drug use?: No history of alcohol / drug abuse                    Sleep: Fair  Appetite:  Good  Current Medications: Current Facility-Administered Medications  Medication Dose Route Frequency Provider Last Rate Last Admin   acetaminophen (TYLENOL) tablet 650 mg  650 mg Oral Q6H PRN Laveda Abbe, NP   650 mg at 10/06/21 1054   alum & mag hydroxide-simeth (MAALOX/MYLANTA) 200-200-20 MG/5ML suspension 30 mL  30 mL Oral Q4H PRN Maryagnes Amos, FNP       hydrOXYzine (ATARAX/VISTARIL) tablet 25 mg  25 mg Oral TID PRN Maryagnes Amos, FNP   25 mg at 10/06/21 1852   magnesium hydroxide (MILK OF MAGNESIA) suspension 30 mL  30 mL Oral Daily PRN Maryagnes Amos, FNP   30 mL at 10/06/21 1853   nicotine (NICODERM CQ - dosed in mg/24 hours) patch 14 mg  14 mg Transdermal Daily Laveda Abbe, NP   14 mg at 10/07/21 0842   traZODone (DESYREL) tablet 50 mg  50 mg Oral QHS PRN Maryagnes Amos, FNP   50 mg at 10/06/21 2110   venlafaxine XR (EFFEXOR-XR) 24 hr capsule 75 mg  75 mg Oral Q breakfast Park Pope, MD   75 mg at 10/07/21 0981    Lab Results:  Results for orders placed or performed during the hospital encounter of 10/05/21 (from the past 48 hour(s))  Resp Panel by RT-PCR (Flu A&B, Covid) Nasopharyngeal Swab     Status: None   Collection Time: 10/05/21  8:24 PM   Specimen: Nasopharyngeal Swab; Nasopharyngeal(NP) swabs in vial transport  medium  Result Value Ref Range   SARS Coronavirus 2 by RT PCR NEGATIVE NEGATIVE    Comment: (NOTE) SARS-CoV-2 target nucleic acids are NOT DETECTED.  The SARS-CoV-2 RNA is generally detectable in upper respiratory specimens during the acute phase of infection. The lowest concentration of SARS-CoV-2 viral copies this assay can detect is 138 copies/mL. A negative result does not preclude SARS-Cov-2 infection and should not be used as the sole basis for treatment or other patient management decisions. A negative result may occur with  improper specimen collection/handling, submission of specimen other than nasopharyngeal swab, presence of viral mutation(s) within the areas targeted by this assay, and inadequate number of  viral copies(<138 copies/mL). A negative result must be combined with clinical observations, patient history, and epidemiological information. The expected result is Negative.  Fact Sheet for Patients:  BloggerCourse.com  Fact Sheet for Healthcare Providers:  SeriousBroker.it  This test is no t yet approved or cleared by the [place] FDA and  has been authorized for detection and/or diagnosis of SARS-CoV-2 by FDA under an Emergency Use Authorization (EUA). This EUA will remain  in effect (meaning this test can be used) for the duration of the COVID-19 declaration under Section 564(b)(1) of the Act, 21 U.S.C.section 360bbb-3(b)(1), unless the authorization is terminated  or revoked sooner.       Influenza A by PCR NEGATIVE NEGATIVE   Influenza B by PCR NEGATIVE NEGATIVE    Comment: (NOTE) The Xpert Xpress SARS-CoV-2/FLU/RSV plus assay is intended as an aid in the diagnosis of influenza from Nasopharyngeal swab specimens and should not be used as a sole basis for treatment. Nasal washings and aspirates are unacceptable for Xpert Xpress SARS-CoV-2/FLU/RSV testing.  Fact Sheet for  Patients: BloggerCourse.com  Fact Sheet for Healthcare Providers: SeriousBroker.it  This test is not yet approved or cleared by the [place] FDA and has been authorized for detection and/or diagnosis of SARS-CoV-2 by FDA under an Emergency Use Authorization (EUA). This EUA will remain in effect (meaning this test can be used) for the duration of the COVID-19 declaration under Section 564(b)(1) of the Act, 21 U.S.C. section 360bbb-3(b)(1), unless the authorization is terminated or revoked.  Performed at [facility], [address], [place], [place] [address]   CBC     Status: None   Collection Time: 10/06/21  6:31 AM  Result Value Ref Range   WBC 5.9 4.0 - 10.5 K/uL   RBC 5.34 4.22 - 5.81 MIL/uL   Hemoglobin 16.8 13.0 - 17.0 g/dL   HCT 56.4 33.2 - 95.1 %   MCV 90.4 80.0 - 100.0 fL   MCH 31.5 26.0 - 34.0 pg   MCHC 34.8 30.0 - 36.0 g/dL   RDW 88.4 16.6 - 06.3 %   Platelets 207 150 - 400 K/uL   nRBC 0.0 0.0 - 0.2 %    Comment: Performed at [facility], [address], [place], [place] [address]  Comprehensive metabolic panel     Status: Abnormal   Collection Time: 10/06/21  6:31 AM  Result Value Ref Range   Sodium 138 135 - 145 mmol/L   Potassium 3.3 (L) 3.5 - 5.1 mmol/L   Chloride 103 98 - 111 mmol/L   CO2 28 22 - 32 mmol/L   Glucose, Bld 98 70 - 99 mg/dL    Comment: Glucose reference range applies only to samples taken after fasting for at least 8 hours.   BUN 18 6 - 20 mg/dL   Creatinine, Ser 0.93 0.61 - 1.24 mg/dL   Calcium 9.7 8.9 - 23.5 mg/dL   Total Protein 7.5 6.5 - 8.1 g/dL   Albumin 4.6 3.5 - 5.0 g/dL   AST 23 15 - 41 U/L   ALT 27 0 - 44 U/L   Alkaline Phosphatase 79 38 - 126 U/L   Total Bilirubin 0.7 0.3 - 1.2 mg/dL   GFR, Estimated >57 >32 mL/min    Comment: (NOTE) Calculated using the CKD-EPI Creatinine Equation (2021)    Anion gap 7 5 - 15    Comment: Performed  at [facility], [address], [place], [place] [address]  Ethanol  Status: None   Collection Time: 10/06/21  6:31 AM  Result Value Ref Range   Alcohol, Ethyl (B) <10 <10 mg/dL    Comment: (NOTE) Lowest detectable limit for serum alcohol is 10 mg/dL.  For medical purposes only. Performed at [facility], [address], [place], [place] [address]   Hemoglobin A1c     Status: None   Collection Time: 10/06/21  6:31 AM  Result Value Ref Range   Hgb A1c MFr Bld 4.9 4.8 - 5.6 %    Comment: (NOTE) Pre diabetes:          5.7%-6.4%  Diabetes:              >6.4%  Glycemic control for   <7.0% adults with diabetes    Mean Plasma Glucose 93.93 mg/dL    Comment: Performed at [facility], [address], [place], [place] [address]  Lipid panel     Status: None   Collection Time: 10/06/21  6:31 AM  Result Value Ref Range   Cholesterol 162 0 - 200 mg/dL   Triglycerides 098 <119 mg/dL   HDL 50 >14 mg/dL   Total CHOL/HDL Ratio 3.2 RATIO   VLDL 23 0 - 40 mg/dL   LDL Cholesterol 89 0 - 99 mg/dL    Comment:        Total Cholesterol/HDL:CHD Risk Coronary Heart Disease Risk Table                     Men   Women  1/2 Average Risk   3.4   3.3  Average Risk       5.0   4.4  2 X Average Risk   9.6   7.1  3 X Average Risk  23.4   11.0        Use the calculated Patient Ratio above and the CHD Risk Table to determine the patient's CHD Risk.        ATP III CLASSIFICATION (LDL):  <100     mg/dL   Optimal  782-956  mg/dL   Near or Above                    Optimal  130-159  mg/dL   Borderline  213-086  mg/dL   High  >578     mg/dL   Very High Performed at [facility], [address] [place], [place] [address]   TSH     Status: None   Collection Time: 10/06/21  6:31 AM  Result Value Ref Range   TSH 0.891 0.350 - 4.500 uIU/mL    Comment: Performed by a 3rd Generation assay with a functional sensitivity of <=0.01  uIU/mL. Performed at [facility], [address], [place], [place] [address]   Urinalysis, Complete w Microscopic     Status: Abnormal   Collection Time: 10/06/21  9:39 AM  Result Value Ref Range   Color, Urine YELLOW YELLOW   APPearance TURBID (A) CLEAR   Specific Gravity, Urine 1.025 1.005 - 1.030   pH 5.0 5.0 - 8.0   Glucose, UA NEGATIVE NEGATIVE mg/dL   Hgb urine dipstick NEGATIVE NEGATIVE   Bilirubin Urine NEGATIVE NEGATIVE   Ketones, ur NEGATIVE NEGATIVE mg/dL   Protein, ur NEGATIVE NEGATIVE mg/dL   Nitrite NEGATIVE NEGATIVE   Leukocytes,Ua NEGATIVE NEGATIVE   RBC / HPF 0-5 0 - 5 RBC/hpf   WBC, UA 0-5 0 - 5 WBC/hpf  Bacteria, UA RARE (A) NONE SEEN   Mucus PRESENT    Ca Oxalate Crys, UA PRESENT     Comment: Performed at [facility], [address] [place], [place] [address]  Iron and TIBC     Status: Abnormal   Collection Time: 10/07/21  6:22 AM  Result Value Ref Range   Iron 131 45 - 182 ug/dL   TIBC 419 379 - 024 ug/dL   Saturation Ratios 40 (H) 17.9 - 39.5 %   UIBC 196 ug/dL    Comment: Performed at [facility], [address] [place], [place] [address]  Ferritin     Status: None   Collection Time: 10/07/21  6:22 AM  Result Value Ref Range   Ferritin 88 24 - 336 ng/mL    Comment: Performed at [facility], [address], [place], [place] [address]    Blood Alcohol level:  Lab Results  Component Value Date   [facility] <10 10/06/2021   ETH <5 04/04/2016    Metabolic Disorder Labs: Lab Results  Component Value Date   HGBA1C 4.9 10/06/2021   MPG 93.93 10/06/2021   MPG 114 11/16/2015   Lab Results  Component Value Date   PROLACTIN 6.8 11/16/2015   Lab Results  Component Value Date   CHOL 162 10/06/2021   TRIG 115 10/06/2021   HDL 50 10/06/2021   CHOLHDL 3.2 10/06/2021   VLDL 23 10/06/2021   LDLCALC 89 10/06/2021   LDLCALC 127 (H) 04/08/2016    Physical Findings: AIMS:  Facial and Oral Movements Muscles of Facial Expression: None, normal Lips and Perioral Area: None, normal Jaw: None, normal Tongue: None, normal,Extremity Movements Upper (arms, wrists, hands, fingers): None, normal Lower (legs, knees, ankles, toes): None, normal, Trunk Movements Neck, shoulders, hips: None, normal, Overall Severity Severity of abnormal movements (highest score from questions above): None, normal Incapacitation due to abnormal movements: None, normal Patient's awareness of abnormal movements (rate only patient's report): No Awareness, Dental Status Current problems with teeth and/or dentures?: No Does patient usually wear dentures?: No  CIWA:    COWS:     Musculoskeletal: Strength & Muscle Tone: within normal limits Gait & Station: normal Patient leans: N/A  Psychiatric Specialty Exam:  Presentation  General Appearance: Appropriate for Environment; Casual  Eye Contact:Fair  Speech:Clear and Coherent; Normal Rate  Speech Volume:Normal  Handedness:Right   Mood and Affect  Mood:Depressed; Anxious  Affect:Congruent; Depressed   Thought Process  Thought Processes:Coherent; Goal Directed; Linear  Descriptions of Associations:Intact  Orientation:Full (Time, Place and Person)  Thought Content:Logical  History of Schizophrenia/Schizoaffective disorder:No  Duration of Psychotic Symptoms:No data recorded Hallucinations:Hallucinations: None  Ideas of Reference:None  Suicidal Thoughts:Suicidal Thoughts: Yes, Passive SI Passive Intent and/or Plan: Without Intent; Without Plan; Without Means to Carry Out  Homicidal Thoughts:Homicidal Thoughts: No   Sensorium  Memory:Immediate Good; Recent Good; Remote Good  Judgment:Fair  Insight:Fair   Executive Functions  Concentration:Fair  Attention Span:Fair  Recall:Fair  Fund of Knowledge:Fair  Language:Fair   Psychomotor Activity  Psychomotor Activity:Psychomotor Activity: Normal   Assets   Assets:Communication Skills; Desire for Improvement; Housing; Health and safety inspector; Physical Health   Sleep  Sleep:Sleep: Fair Number of Hours of Sleep: 6    Physical Exam: Physical Exam Vitals and nursing note reviewed.  Constitutional:      Appearance: Normal appearance. He is normal weight.  HENT:     Head: Normocephalic and atraumatic.  Pulmonary:     Effort: Pulmonary effort is normal.  Neurological:     General: No focal deficit present.     Mental Status: He is oriented to person, place, and time.   Review of Systems  Respiratory:  Negative for shortness of breath.   Cardiovascular:  Negative for chest pain.  Gastrointestinal:  Negative for abdominal pain, constipation, diarrhea, heartburn, nausea and vomiting.  Neurological:  Negative for headaches.  Blood pressure 107/87, pulse 73, temperature 98 F (36.7 C), temperature source Oral, resp. rate 18, height 5\' 6"  (1.676 m), weight 93.2 kg, SpO2 100 %. Body mass index is 33.17 kg/m.   Treatment Plan Summary: Daily contact with patient to assess and evaluate symptoms and progress in treatment and Medication management  ASSESSMENT Patient is a 25 year old male with reported history of MDD, borderline personality disorder, anxiety, ADHD presents to [facility] as a walk-in for refractory depression.  Patient agreeable to a trial of Effexor XR for antidepressant.  Patient able to contract for safety while on the unit but does endorse passive SI.  Patient denies HI/AVH.  Ordered probiotics and fiber supplement for patient's constipation.  We will continue to monitor mood and GI complaints.  Plan to discuss using Nicorette gum as an adjunct to nicotine patch should patient still have nicotine cravings.      PLAN MDD severe, recurrent PTSD Suicidal Ideation -Effexor 75 mg daily tomorrow for depression.  Risk/benefits/side effects discussed with patient and patient is agreeable to medication trial   Nicotine Use  Disorder -Nicoderm 21 mg patch, will continue to monitor for nicotine cravings   Medical problems Hypokalemia Potassium 3.3, Klor-con 40 meq, BMP tomorrow   PRN Tylenol for pain Maalox for indigestion Hydroxyzine for anxiety Milk of Magnesia for constipation Trazodone for sleep   3. Safety and Monitoring: Involuntary admission to inpatient psychiatric unit for safety, stabilization and treatment Daily contact with patient to assess and evaluate symptoms and progress in treatment Patient's case to be discussed in multi-disciplinary team meeting Observation Level : q15 minute checks Vital signs: q12 hours Precautions: suicide, elopement, and assault   4. Discharge Planning: Social work and case management to assist with discharge planning and identification of hospital follow-up needs prior to discharge Estimated LOS: 5-7 days Discharge Concerns: Need to establish a safety plan; Medication compliance and effectiveness Discharge Goals: Return home with outpatient referrals for mental health follow-up including medication management/psychotherapy  [facility], MD 10/07/2021, 11:28 AM

## 2021-10-07 NOTE — Progress Notes (Signed)
Pt visible on the unit this evening, pt state dhe was doing better. Pt given PRN Trazodone and Vistaril per MAR. Pt required another dose 50 mg Trazodone.    10/07/21 2200  Psych Admission Type (Psych Patients Only)  Admission Status Involuntary  Psychosocial Assessment  Patient Complaints Anxiety  Eye Contact Fair  Facial Expression Anxious;Pensive  Affect Anxious;Sad  Speech Logical/coherent  Interaction Assertive  Motor Activity Slow  Appearance/Hygiene Unremarkable  Behavior Characteristics Cooperative;Appropriate to situation  Mood Anxious  Thought Process  Coherency Concrete thinking  Content WDL  Delusions None reported or observed  Perception WDL  Hallucination None reported or observed  Judgment Poor  Confusion None  Danger to Self  Current suicidal ideation? Denies  Danger to Others  Danger to Others None reported or observed

## 2021-10-07 NOTE — [facility] Group Notes (Signed)
Adult Psychoeducational Group Note  Date:  10/07/2021 Time:  12:24 PM  Group Topic/Focus:  Goals Group:   The focus of this group is to help patients establish daily goals to achieve during treatment and discuss how the patient can incorporate goal setting into their daily lives to aide in recovery.  Participation Level:  Active  Participation Quality:  Appropriate and Attentive  Affect:  Anxious and Appropriate  Cognitive:  Alert and Appropriate  Insight: Appropriate and Good  Engagement in Group:  Engaged  Modes of Intervention:  Discussion  Additional Comments:  Pt has a goal of managing anxiety.   [name] Layna Roeper 10/07/2021, 12:24 PM

## 2021-10-07 NOTE — Group Note (Signed)
Recreation Therapy Group Note   Group Topic:Stress Management  Group Date: 10/07/2021 Start Time: 0930 End Time: 0950 Facilitators: Caroll Rancher, LRT/CTRS Location: [address] Dayroom  Goal Area(s) Addresses:  Patient will actively participate in stress management techniques presented during session.  Patient will successfully identify benefit of practicing stress management post d/c.    Group Description:  LRT explained the stress management technique of meditation. LRT proceeded to play a meditation focused on self forgiveness.  Patient was asked to participate in the technique introduced during session.  Patients were given suggestions of ways to access scripts post d/c and encouraged to explore Youtube and other apps available on smartphones, tablets, and computers.   Affect/Mood: N/A   Participation Level: Did not attend    Clinical Observations/Individualized Feedback: Pt did not attend group.    Plan: Continue to engage patient in RT group sessions 2-3x/week.   Caroll Rancher, LRT/CTRS 10/07/2021 1:00 PM

## 2021-10-07 NOTE — [facility] Group Notes (Signed)
[facility] Group Notes:  (Nursing/MHT/Case Management/Adjunct)  Date:  10/07/2021  Time:  10:29 AM  Type of Therapy:  Group Therapy  Participation Level:  Active  Participation Quality:  Appropriate  Affect:  Appropriate  Cognitive:  Appropriate  Insight:  Good  Engagement in Group:  Engaged  Modes of Intervention:  Discussion  Summary of Progress/Problems:Pt attended group and participated in discussion.  [name] 10/07/2021, 10:29 AM

## 2021-10-07 NOTE — BH IP Treatment Plan (Signed)
Interdisciplinary Treatment and Diagnostic Plan Update  10/07/2021 Time of Session: 9:30am  [name] MRN: [id number]  Principal Diagnosis: MDD (major depressive disorder), recurrent episode, severe ([place])  Secondary Diagnoses: Principal Problem:   MDD (major depressive disorder), recurrent episode, severe ([place]) Active Problems:   ADHD (attention deficit hyperactivity disorder), combined type   PTSD (post-traumatic stress disorder)   Suicidal ideation   Substance abuse ([place])   Nicotine use disorder   Current Medications:  Current Facility-Administered Medications  Medication Dose Route Frequency Provider Last Rate Last Admin   acetaminophen (TYLENOL) tablet 650 mg  650 mg Oral Q6H PRN Ethelene Hal, NP   650 mg at 10/06/21 1054   [START ON 10/08/2021] acidophilus (RISAQUAD) capsule 2 capsule  2 capsule Oral Daily France Ravens, MD       alum & mag hydroxide-simeth (MAALOX/MYLANTA) 200-200-20 MG/5ML suspension 30 mL  30 mL Oral Q4H PRN Starkes-Perry, Gayland Curry, FNP       fiber (NUTRISOURCE FIBER) 1 packet  1 packet Per Tube BID France Ravens, MD       hydrOXYzine (ATARAX/VISTARIL) tablet 25 mg  25 mg Oral TID PRN Suella Broad, FNP   25 mg at 10/07/21 1450   magnesium hydroxide (MILK OF MAGNESIA) suspension 30 mL  30 mL Oral Daily PRN Suella Broad, FNP   30 mL at 10/07/21 1450   nicotine (NICODERM CQ - dosed in mg/24 hours) patch 14 mg  14 mg Transdermal Daily Ethelene Hal, NP   14 mg at 10/07/21 0842   traZODone (DESYREL) tablet 50 mg  50 mg Oral QHS PRN Suella Broad, FNP   50 mg at 10/06/21 2110   venlafaxine XR (EFFEXOR-XR) 24 hr capsule 75 mg  75 mg Oral Q breakfast France Ravens, MD   75 mg at 10/07/21 0841   PTA Medications: No medications prior to admission.    Patient Stressors: Financial difficulties   Marital or family conflict   Medication change or noncompliance   Occupational concerns    Patient Strengths: Capable of  independent living  General fund of knowledge  Motivation for treatment/growth  Physical Health   Treatment Modalities: Medication Management, Group therapy, Case management,  1 to 1 session with clinician, Psychoeducation, Recreational therapy.   Physician Treatment Plan for Primary Diagnosis: MDD (major depressive disorder), recurrent episode, severe ([place]) Long Term Goal(s): Improvement in symptoms so as ready for discharge   Short Term Goals: Ability to identify changes in lifestyle to reduce recurrence of condition will improve Ability to verbalize feelings will improve Ability to disclose and discuss suicidal ideas Ability to demonstrate self-control will improve Ability to identify and develop effective coping behaviors will improve Ability to maintain clinical measurements within normal limits will improve Compliance with prescribed medications will improve Ability to identify triggers associated with substance abuse/mental health issues will improve  Medication Management: Evaluate patient's response, side effects, and tolerance of medication regimen.  Therapeutic Interventions: 1 to 1 sessions, Unit Group sessions and Medication administration.  Evaluation of Outcomes: Not Met  Physician Treatment Plan for Secondary Diagnosis: Principal Problem:   MDD (major depressive disorder), recurrent episode, severe ([place]) Active Problems:   ADHD (attention deficit hyperactivity disorder), combined type   PTSD (post-traumatic stress disorder)   Suicidal ideation   Substance abuse ([place])   Nicotine use disorder  Long Term Goal(s): Improvement in symptoms so as ready for discharge   Short Term Goals: Ability to identify changes in lifestyle to reduce recurrence of  condition will improve Ability to verbalize feelings will improve Ability to disclose and discuss suicidal ideas Ability to demonstrate self-control will improve Ability to identify and develop effective coping behaviors  will improve Ability to maintain clinical measurements within normal limits will improve Compliance with prescribed medications will improve Ability to identify triggers associated with substance abuse/mental health issues will improve     Medication Management: Evaluate patient's response, side effects, and tolerance of medication regimen.  Therapeutic Interventions: 1 to 1 sessions, Unit Group sessions and Medication administration.  Evaluation of Outcomes: Not Met   RN Treatment Plan for Primary Diagnosis: MDD (major depressive disorder), recurrent episode, severe ([place]) Long Term Goal(s): Knowledge of disease and therapeutic regimen to maintain health will improve  Short Term Goals: Ability to remain free from injury will improve, Ability to participate in decision making will improve, Ability to verbalize feelings will improve, Ability to disclose and discuss suicidal ideas, and Ability to identify and develop effective coping behaviors will improve  Medication Management: RN will administer medications as ordered by provider, will assess and evaluate patient's response and provide education to patient for prescribed medication. RN will report any adverse and/or side effects to prescribing provider.  Therapeutic Interventions: 1 on 1 counseling sessions, Psychoeducation, Medication administration, Evaluate responses to treatment, Monitor vital signs and CBGs as ordered, Perform/monitor CIWA, COWS, AIMS and Fall Risk screenings as ordered, Perform wound care treatments as ordered.  Evaluation of Outcomes: Not Met   LCSW Treatment Plan for Primary Diagnosis: MDD (major depressive disorder), recurrent episode, severe ([place]) Long Term Goal(s): Safe transition to appropriate next level of care at discharge, Engage patient in therapeutic group addressing interpersonal concerns.  Short Term Goals: Engage patient in aftercare planning with referrals and resources, Increase social support,  Increase emotional regulation, Facilitate acceptance of mental health diagnosis and concerns, Identify triggers associated with mental health/substance abuse issues, and Increase skills for wellness and recovery  Therapeutic Interventions: Assess for all discharge needs, 1 to 1 time with Social worker, Explore available resources and support systems, Assess for adequacy in community support network, Educate family and significant other(s) on suicide prevention, Complete Psychosocial Assessment, Interpersonal group therapy.  Evaluation of Outcomes: Not Met   Progress in Treatment: Attending groups: Yes. Participating in groups: Yes. Taking medication as prescribed: Yes. Toleration medication: Yes. Family/Significant other contact made: No, will contact:  [place]  Patient understands diagnosis: Yes. and No. Discussing patient identified problems/goals with staff: Yes. Medical problems stabilized or resolved: Yes. Denies suicidal/homicidal ideation: Yes. Issues/concerns per patient self-inventory: No.   New problem(s) identified: No, Describe:  None   New Short Term/Long Term Goal(s): medication stabilization, elimination of SI thoughts, development of comprehensive mental wellness plan.   Patient Goals: "To mange my depression and anxiety and to work on better communication skills"   Discharge Plan or Barriers: Patient recently admitted. CSW will continue to follow and assess for appropriate referrals and possible discharge planning.   Reason for Continuation of Hospitalization: Anxiety Depression Medication stabilization Suicidal ideation  Estimated Length of Stay: 3 to 5 days    Scribe for Treatment Team: Carney Harder 10/07/2021 3:23 PM

## 2021-10-07 NOTE — Plan of Care (Signed)
  Problem: Education: Goal: Knowledge of [facility] Education information/materials will improve Outcome: Progressing Goal: Emotional status will improve Outcome: Progressing Goal: Mental status will improve Outcome: Progressing Goal: Verbalization of understanding the information provided will improve Outcome: Progressing   

## 2021-10-07 NOTE — Progress Notes (Signed)
Progress note    10/07/21 1000  Psych Admission Type (Psych Patients Only)  Admission Status Involuntary  Psychosocial Assessment  Patient Complaints Anxiety  Eye Contact Fair  Facial Expression Anxious;Pensive  Affect Anxious;Sad  Speech Logical/coherent  Interaction Assertive  Motor Activity Slow  Appearance/Hygiene Unremarkable  Behavior Characteristics Cooperative;Appropriate to situation;Anxious  Mood Anxious;Pleasant  Thought Process  Coherency Concrete thinking  Content WDL  Delusions None reported or observed  Perception WDL  Hallucination None reported or observed  Judgment Poor  Confusion None  Danger to Self  Current suicidal ideation? Denies  Danger to Others  Danger to Others None reported or observed

## 2021-10-07 NOTE — Progress Notes (Signed)
Patient rated his day as a 7 out of a possible 10 since he had a "decent day". He is feeling better. His goal for tomorrow is to find some new coping skills to work on.

## 2021-10-07 NOTE — Group Note (Signed)
LCSW Group Therapy Note  Group Date: 10/07/2021 Start Time: 1300 End Time: 1400   Type of Therapy and Topic:  Group Therapy: Using "I" Statements  Participation Level:  Active  Description of Group:  Patients were asked to provide details of some interpersonal conflicts they have experienced. Patients were then educated about "I" statements, communication which focuses on feelings or views of the speaker rather than what the other person is doing. T group members were asked to reflect on past conflicts and to provide specific examples for utilizing "I" statements.  Therapeutic Goals:  Patients will verbalize understanding of ineffective communication and effective communication. Patients will be able to empathize with whom they are having conflict. Patients will practice effective communication in the form of "I" statements.    Summary of Patient Progress:  [name] shared ways to effectively communicate with peers.. The patient was present/active throughout the session and proved open to feedback from CSW and peers. Patient demonstrated good insight into the subject matter, was respectful of peers, and was present throughout the entire session.  Therapeutic Modalities:   Cognitive Behavioral Therapy Solution-Focused Therapy    Beatris Si, LCSW 10/07/2021  2:19 PM

## 2021-10-08 DIAGNOSIS — F332 Major depressive disorder, recurrent severe without psychotic features: Secondary | ICD-10-CM | POA: Diagnosis not present

## 2021-10-08 LAB — BASIC METABOLIC PANEL
Anion gap: 7 (ref 5–15)
BUN: 15 mg/dL (ref 6–20)
CO2: 29 mmol/L (ref 22–32)
Calcium: 10 mg/dL (ref 8.9–10.3)
Chloride: 110 mmol/L (ref 98–111)
Creatinine, Ser: 0.99 mg/dL (ref 0.61–1.24)
GFR, Estimated: >60 mL/min (ref >60)
Glucose, Bld: 82 mg/dL (ref 70–99)
Potassium: 4.1 mmol/L (ref 3.5–5.1)
Sodium: 146 mmol/L — ABNORMAL HIGH (ref 135–145)

## 2021-10-08 MED ORDER — TRAZODONE HCL 100 MG PO TABS
100.0000 mg | ORAL_TABLET | Freq: Every day | ORAL | Status: DC
  Administered 2021-10-08 – 2021-10-10 (×3): 100 mg via ORAL
  Filled 2021-10-08 (×6): qty 1

## 2021-10-08 MED ORDER — VITAMIN D (ERGOCALCIFEROL) 1.25 MG (50000 UNIT) PO CAPS
50000.0000 [IU] | ORAL_CAPSULE | ORAL | Status: DC
  Filled 2021-10-08: qty 1

## 2021-10-08 MED ORDER — TRAZODONE HCL 100 MG PO TABS: 100.0000 mg | ORAL_TABLET | Freq: Every evening | ORAL | Status: DC | PRN

## 2021-10-08 NOTE — Progress Notes (Signed)
The patient shared in group that he felt less anxious today and that he socialized with his peers. His goal for tomorrow is to prepare for discharge.

## 2021-10-08 NOTE — Progress Notes (Signed)
Progress note  Pt found in bed; compliant with medication administration. Pt has complaints of anxiety and constipation still. Pt is minimal and guarded with assessment but pleasant. Pt has been seen in the milieu and at rec. Pt denies si/hi/ah/vh and verbally agrees to approach staff if these become apparent or before harming themselves/others while at [facility]hh.  A: Pt provided support and encouragement. Pt given medication per protocol and standing orders. Q71m safety checks implemented and continued.  R: Pt safe on the unit. Will continue to monitor.

## 2021-10-08 NOTE — Progress Notes (Addendum)
[facility] MD Progress Note  10/08/2021 6:35 PM [name]  MRN:  [id number] Subjective:  Patient is a 25 year old male with reported history of MDD, borderline personality disorder, anxiety, ADHD presents to [facility] as a walk-in for refractory depression.  Chart Review, 24 hr Events: The patient's chart was reviewed and nursing notes were reviewed. The patient's case was discussed in multidisciplinary team meeting.  Per MAR: - Patient is compliant with scheduled meds. - PRNs: trazodone x 2 Per RN notes, no documented behavioral issues and is attending group. Patient slept, 6 hours  TODAY'S INTERVIEW Patient seen and staffed with attending Dr. Mason Jim.  Patient states that he is in a better mood.  Patient feels that yesterday was also a good day.  Patient states that medications are helping with his mood.  Patient still endorsing mild constipation due to his IBS.  Encourage patient to take fiber and probiotics.  Patient agreeable to this and will take some today.  Patient states that he has required trazodone 100 mg total to sleep.  Patient does state that he has slept well after that as well as has been eating well, hydrating appropriately and participating groups.  Beyond constipation, patient denies any other physical complaints.  Patient feels that he will likely be ready for discharge this weekend.  Patient denies SI/HI/AVH.  Patient is able to contract for safety while on the unit.  Patient denies paranoia, delusions, ideas of reference, first rank symptoms, thought broadcasting.    Principal Problem: MDD (major depressive disorder), recurrent episode, severe (HCC) Diagnosis: Principal Problem:   MDD (major depressive disorder), recurrent episode, severe (HCC) Active Problems:   ADHD (attention deficit hyperactivity disorder), combined type   PTSD (post-traumatic stress disorder)   Suicidal ideation   Substance abuse (HCC)   Nicotine use disorder  Total Time spent with  patient:  I personally spent 35 minutes on the unit in direct patient care. The direct patient care time included face-to-face time with the patient, reviewing the patient's chart, communicating with other professionals, and coordinating care. Greater than 50% of this time was spent in counseling or coordinating care with the patient regarding goals of hospitalization, psycho-education, and discharge planning needs.   Past Psychiatric History: see H&P  Past Medical History:  Past Medical History:  Diagnosis Date   ADHD (attention deficit hyperactivity disorder)    Anxiety    Asthma    Constipation    Deliberate self-cutting    Depression    IBS (irritable bowel syndrome)    Irritable bowel syndrome    Post traumatic stress disorder (PTSD)    Suicide attempt (HCC)    Vision abnormalities    Pt states he wears glasses   History reviewed. No pertinent surgical history. Family History:  Family History  Problem Relation Age of Onset   ADD / ADHD Brother    Hirschsprung's disease Neg Hx    Family Psychiatric  History: see H&P Social History:  Social History   Substance and Sexual Activity  Alcohol Use Not Currently   Comment: Pt states he drinks on occassion     Social History   Substance and Sexual Activity  Drug Use No   Types: Oxycodone   Comment: last used in 7th grade when he "tried it"/used oxycodone last 2015    Social History   Socioeconomic History   Marital status: Single    Spouse name: Not on file   Number of children: Not on file   Years of education: Not on  file   Highest education level: Not on file  Occupational History   Not on file  Tobacco Use   Smoking status: Every Day    Packs/day: 1.50    Types: Cigarettes, Cigars   Smokeless tobacco: Never  Vaping Use   Vaping Use: Never used  Substance and Sexual Activity   Alcohol use: Not Currently    Comment: Pt states he drinks on occassion   Drug use: No    Types: Oxycodone    Comment: last used  in 7th grade when he "tried it"/used oxycodone last 2015   Sexual activity: Never  Other Topics Concern   Not on file  Social History Narrative   ** Merged History Encounter **       9th grade         Social Determinants of Health   Financial Resource Strain: Not on file  Food Insecurity: Not on file  Transportation Needs: Not on file  Physical Activity: Not on file  Stress: Not on file  Social Connections: Not on file   Additional Social History:    Pain Medications: See MAR Prescriptions: SWee MAR Over the Counter: See MAR History of alcohol / drug use?: No history of alcohol / drug abuse                    Sleep: Fair  Appetite:  Good  Current Medications: Current Facility-Administered Medications  Medication Dose Route Frequency Provider Last Rate Last Admin   acetaminophen (TYLENOL) tablet 650 mg  650 mg Oral Q6H PRN Laveda Abbe, NP   650 mg at 10/06/21 1054   acidophilus (RISAQUAD) capsule 2 capsule  2 capsule Oral Daily Park Pope, MD   2 capsule at 10/08/21 0756   alum & mag hydroxide-simeth (MAALOX/MYLANTA) 200-200-20 MG/5ML suspension 30 mL  30 mL Oral Q4H PRN Maryagnes Amos, FNP       hydrOXYzine (ATARAX/VISTARIL) tablet 25 mg  25 mg Oral TID PRN Maryagnes Amos, FNP   25 mg at 10/08/21 1603   magnesium hydroxide (MILK OF MAGNESIA) suspension 30 mL  30 mL Oral Daily PRN Maryagnes Amos, FNP   30 mL at 10/07/21 1450   nicotine (NICODERM CQ - dosed in mg/24 hours) patch 21 mg  21 mg Transdermal Daily Park Pope, MD   21 mg at 10/08/21 0843   polycarbophil (FIBERCON) tablet 625 mg  625 mg Oral BID Maryagnes Amos, FNP   625 mg at 10/08/21 1603   polyethylene glycol (MIRALAX / GLYCOLAX) packet 17 g  17 g Oral Once Mariel Craft, MD       Followed by   polyethylene glycol (MIRALAX / GLYCOLAX) packet 17 g  17 g Oral Daily PRN Mariel Craft, MD   17 g at 10/08/21 1135   traZODone (DESYREL) tablet 50 mg  50 mg Oral  QHS PRN Maryagnes Amos, FNP   50 mg at 10/07/21 2107   venlafaxine XR (EFFEXOR-XR) 24 hr capsule 75 mg  75 mg Oral Q breakfast Park Pope, MD   75 mg at 10/08/21 0756    Lab Results:  Results for orders placed or performed during the hospital encounter of 10/05/21 (from the past 48 hour(s))  Iron and TIBC     Status: Abnormal   Collection Time: 10/07/21  6:22 AM  Result Value Ref Range   Iron 131 45 - 182 ug/dL   TIBC 254 270 - 623 ug/dL   Saturation Ratios  40 (H) 17.9 - 39.5 %   UIBC 196 ug/dL    Comment: Performed at [facility], [address] [place], [place] [address]  Ferritin     Status: None   Collection Time: 10/07/21  6:22 AM  Result Value Ref Range   Ferritin 88 24 - 336 ng/mL    Comment: Performed at [facility], [address] [place], [place] [address]  VITAMIN D 25 Hydroxy (Vit-D Deficiency, Fractures)     Status: Abnormal   Collection Time: 10/07/21  6:22 AM  Result Value Ref Range   Vit D, 25-Hydroxy 23.36 (L) 30 - 100 ng/mL    Comment: (NOTE) Vitamin D deficiency has been defined by the Institute of Medicine  and an Endocrine Society practice guideline as a level of serum 25-OH  vitamin D less than 20 ng/mL (1,2). The Endocrine Society went on to  further define vitamin D insufficiency as a level between 21 and 29  ng/mL (2).  1. IOM (Institute of Medicine). 2010. Dietary reference intakes for  calcium and D. [place]: [employer]. 2. Holick MF, Binkley Lufkin, Bischoff-Ferrari HA, et al. Evaluation,  treatment, and prevention of vitamin D deficiency: an Endocrine  Society clinical practice guideline, JCEM. 2011 Jul; 96(7): 1911-30.  Performed at [facility], [address] [place], [place] [address]   Basic metabolic panel     Status: Abnormal   Collection Time: 10/08/21  6:27 AM  Result Value Ref Range   Sodium 146 (H) 135 - 145 mmol/L    Comment: DELTA CHECK NOTED   Potassium  4.1 3.5 - 5.1 mmol/L    Comment: DELTA CHECK NOTED   Chloride 110 98 - 111 mmol/L   CO2 29 22 - 32 mmol/L   Glucose, Bld 82 70 - 99 mg/dL    Comment: Glucose reference range applies only to samples taken after fasting for at least 8 hours.   BUN 15 6 - 20 mg/dL   Creatinine, Ser 3.00 0.61 - 1.24 mg/dL   Calcium 92.3 8.9 - 30.0 mg/dL   GFR, Estimated >76 >22 mL/min    Comment: (NOTE) Calculated using the CKD-EPI Creatinine Equation (2021)    Anion gap 7 5 - 15    Comment: Performed at [facility], [address] [place], [place] [address]    Blood Alcohol level:  Lab Results  Component Value Date   [facility] <10 10/06/2021   ETH <5 04/04/2016    Metabolic Disorder Labs: Lab Results  Component Value Date   HGBA1C 4.9 10/06/2021   MPG 93.93 10/06/2021   MPG 114 11/16/2015   Lab Results  Component Value Date   PROLACTIN 6.8 11/16/2015   Lab Results  Component Value Date   CHOL 162 10/06/2021   TRIG 115 10/06/2021   HDL 50 10/06/2021   CHOLHDL 3.2 10/06/2021   VLDL 23 10/06/2021   LDLCALC 89 10/06/2021   LDLCALC 127 (H) 04/08/2016    Physical Findings: AIMS: Facial and Oral Movements Muscles of Facial Expression: None, normal Lips and Perioral Area: None, normal Jaw: None, normal Tongue: None, normal,Extremity Movements Upper (arms, wrists, hands, fingers): None, normal Lower (legs, knees, ankles, toes): None, normal, Trunk Movements Neck, shoulders, hips: None, normal, Overall Severity Severity of abnormal movements (highest score from questions above): None, normal Incapacitation due to abnormal movements: None, normal Patient's awareness of abnormal movements (rate only patient's report): No Awareness, Dental Status Current problems with teeth  and/or dentures?: No Does patient usually wear dentures?: No  CIWA:    COWS:     Musculoskeletal: Strength & Muscle Tone: within normal limits Gait & Station: normal Patient leans:  N/A  Psychiatric Specialty Exam:  Presentation  General Appearance: Appropriate for Environment; Casual  Eye Contact:Fair  Speech:Clear and Coherent; Normal Rate  Speech Volume:Normal  Handedness:Right   Mood and Affect  Mood:Depressed; Anxious  Affect:Congruent; Depressed   Thought Process  Thought Processes:Coherent; Goal Directed; Linear  Descriptions of Associations:Intact  Orientation:Full (Time, Place and Person)  Thought Content:Logical - denies AVH, paranoia or delusions  History of Schizophrenia/Schizoaffective disorder:No  Duration of Psychotic Symptoms:No data recorded Hallucinations:Hallucinations: None  Ideas of Reference:None  Suicidal Thoughts:Denied  Homicidal Thoughts:Homicidal Thoughts: No   Sensorium  Memory:Immediate Good; Recent Good; Remote Good  Judgment:Fair  Insight:Fair   Executive Functions  Concentration:Fair  Attention Span:Fair  Recall:Fair  Fund of Knowledge:Fair  Language:Fair   Psychomotor Activity  Psychomotor Activity:Psychomotor Activity: Normal   Assets  Assets:Communication Skills; Desire for Improvement; Housing; Health and safety inspector; Physical Health   Sleep  Sleep:5.25 hours  Physical Exam Vitals and nursing note reviewed.  Constitutional:      Appearance: Normal appearance. He is normal weight.  HENT:     Head: Normocephalic and atraumatic.  Pulmonary:     Effort: Pulmonary effort is normal.  Neurological:     General: No focal deficit present.     Mental Status: He is oriented to person, place, and time.   Review of Systems  Respiratory:  Negative for shortness of breath.   Cardiovascular:  Negative for chest pain.  Gastrointestinal:  Negative for abdominal pain, constipation, diarrhea, heartburn, nausea and vomiting.  Neurological:  Negative for headaches.  Blood pressure (!) 141/81, pulse 74, temperature 98.7 F (37.1 C), temperature source Oral, resp. rate 18, height 5\' 6"   (1.676 m), weight 93.2 kg, SpO2 99 %. Body mass index is 33.17 kg/m.   Treatment Plan Summary: Daily contact with patient to assess and evaluate symptoms and progress in treatment and Medication management  ASSESSMENT Patient is a 25 year old male with reported history of MDD, borderline personality disorder, anxiety, ADHD presents to [facility] as a walk-in for refractory depression.  Patient agreeable to a trial of Effexor XR for antidepressant.  Patient able to contract for safety while on the unit.  Patient denies SI/HI/AVH.  Patient appears to be slowly improving in mood.  Patient primary concern currently is his constipation due to IBS.  Will continue to monitor for mood and GI symptoms.     PLAN MDD severe, recurrent without psychosis Unspecified anxiety d/o by hx BPD by hx ADHD by hx -Continue Effexor 75 mg daily.     Nicotine Use Disorder -Nicoderm 21 mg patch, will continue to monitor for nicotine cravings   Medical problems Hypokalemia - resolved Potassium 3.3, Klor-con 40 meq, BMP 10/13 K 4.1  Hypernatremia (Na+ 146) - continue to monitor   Low Vitamin D - start Vitamin D 50,000 IU weekly   IBS Risaquad 2 capsules qd Fibercon tablet 625 mg bid Miralax 17 g daily prn  Abnormal UA (rare bacteria and turbid) Patient currently asymptomatic - f/u with PCP for recheck after discharge  PRN Tylenol for pain Maalox for indigestion Hydroxyzine for anxiety Milk of Magnesia for constipation Trazodone for sleep   3. Safety and Monitoring: Involuntary admission to inpatient psychiatric unit for safety, stabilization and treatment Daily contact with patient to assess and evaluate symptoms and progress in treatment Patient's  case to be discussed in multi-disciplinary team meeting Observation Level : q15 minute checks Vital signs: q12 hours Precautions: suicide, elopement, and assault   4. Discharge Planning: Social work and case management to assist with discharge planning  and identification of hospital follow-up needs prior to discharge Estimated LOS: 5-7 days Discharge Concerns: Need to establish a safety plan; Medication compliance and effectiveness Discharge Goals: Return home with outpatient referrals for mental health follow-up including medication management/psychotherapy  Park Pope, MD 10/08/2021, 6:35 PM

## 2021-10-08 NOTE — Plan of Care (Signed)
  Problem: Activity: Goal: Interest or engagement in activities will improve Outcome: Progressing Goal: Sleeping patterns will improve Outcome: Progressing   Problem: Coping: Goal: Ability to verbalize frustrations and anger appropriately will improve Outcome: Progressing   

## 2021-10-08 NOTE — [facility] Suicide Risk Assessment (Signed)
[facility] INPATIENT:  Family/Significant Other Suicide Prevention Education  Suicide Prevention Education:  Patient Refusal for Family/Significant Other Suicide Prevention Education: The patient [name] has refused to provide written consent for family/significant other to be provided Family/Significant Other Suicide Prevention Education during admission and/or prior to discharge.  Physician notified.  Metro Kung Maddix Heinz 10/08/2021, 10:17 AM

## 2021-10-09 DIAGNOSIS — F332 Major depressive disorder, recurrent severe without psychotic features: Secondary | ICD-10-CM | POA: Diagnosis not present

## 2021-10-09 LAB — URINALYSIS, ROUTINE W REFLEX MICROSCOPIC
Bilirubin Urine: NEGATIVE
Glucose, UA: NEGATIVE mg/dL
Hgb urine dipstick: NEGATIVE
Ketones, ur: NEGATIVE mg/dL
Leukocytes,Ua: NEGATIVE
Nitrite: NEGATIVE
Protein, ur: NEGATIVE mg/dL
Specific Gravity, Urine: 1.014 (ref 1.005–1.030)
pH: 5 (ref 5.0–8.0)

## 2021-10-09 NOTE — [facility] Suicide Risk Assessment (Signed)
[facility] INPATIENT:  Family/Significant Other Suicide Prevention Education  Suicide Prevention Education:  Education Completed; Catha Gosselin [phone] (Mother) has been identified by the patient as the family member/significant other with whom the patient will be residing, and identified as the person(s) who will aid the patient in the event of a mental health crisis (suicidal ideations/suicide attempt).  With written consent from the patient, the family member/significant other has been provided the following suicide prevention education, prior to the and/or following the discharge of the patient.  The suicide prevention education provided includes the following: Suicide risk factors Suicide prevention and interventions National Suicide Hotline telephone number [facility] assessment telephone number [facility] Emergency Assistance 911 [facility] and/or Residential Mobile Crisis Unit telephone number  Request made of family/significant other to: Remove weapons (e.g., guns, rifles, knives), all items previously/currently identified as safety concern.   Remove drugs/medications (over-the-counter, prescriptions, illicit drugs), all items previously/currently identified as a safety concern.  The family member/significant other verbalizes understanding of the suicide prevention education information provided.  The family member/significant other agrees to remove the items of safety concern listed above.  CSW spoke with Mrs. Sherlon Handing who states that her son did speak with her about the reasons that brought him to the hospital.  She states that [place] different [place] and only speaks with him on the phone a few times each week.  She states that her son is open with her about his thoughts and feelings.  Mrs. Sherlon Handing states asked about her son's follow-up and states that she will continue to be a support.  She also states that there are no firearms or weapons in the  home. CSW completed SPE with Mrs. Sherlon Handing.   Metro Kung Eytan Carrigan 10/09/2021, 11:24 AM

## 2021-10-09 NOTE — [facility] Group Notes (Signed)
[facility] Group Notes:  (Nursing/MHT/Case Management/Adjunct)  Date:  10/09/2021  Time:  9:23 AM  Type of Therapy:  Group Therapy  Participation Level:  Active  Participation Quality:  Appropriate  Affect:  Appropriate  Cognitive:  Appropriate  Insight:  Appropriate  Engagement in Group:  Engaged  Modes of Intervention:  Discussion and Education  Summary of Progress/Problems:Pt was engaged in orientation and goals group.  Jaquita Rector 10/09/2021, 9:23 AM

## 2021-10-09 NOTE — Progress Notes (Signed)
   10/08/21 2040  Psych Admission Type (Psych Patients Only)  Admission Status Involuntary  Psychosocial Assessment  Patient Complaints Anxiety;Depression  Eye Contact Fair  Facial Expression Anxious  Affect Anxious;Depressed  Speech Logical/coherent  Interaction Assertive;Minimal  Motor Activity Other (Comment) (wnl)  Appearance/Hygiene Unremarkable  Behavior Characteristics Cooperative;Appropriate to situation;Anxious  Mood Anxious;Depressed  Thought Process  Coherency Concrete thinking  Content WDL  Delusions None reported or observed  Perception WDL  Hallucination None reported or observed  Judgment WDL  Confusion None  Danger to Self  Current suicidal ideation? Passive  Self-Injurious Behavior No self-injurious ideation or behavior indicators observed or expressed   Agreement Not to Harm Self Yes  Description of Agreement Verbal agreement to not harm himself on the unit  Danger to Others  Danger to Others None reported or observed   Pt seen in dayroom. Pt endorses passive SI but contracts for safety. Pt denies HI, AVH and pain. Pt says maybe the food here is giving him stomach upset. Pt had a bowel movement today but said he had to strain. He is receiving fiber supplementation to assist with this, according to pt. Pt says he is working on getting better and reducing his self-harm thoughts.

## 2021-10-09 NOTE — Progress Notes (Signed)
Progress note    10/09/21 0810  Psych Admission Type (Psych Patients Only)  Admission Status Involuntary  Psychosocial Assessment  Patient Complaints Anxiety  Eye Contact Fair  Facial Expression Pensive  Affect Appropriate to circumstance  Speech Logical/coherent  Interaction Assertive  Motor Activity Slow  Appearance/Hygiene Unremarkable  Behavior Characteristics Cooperative;Appropriate to situation  Mood Anxious;Pleasant  Thought Process  Coherency Concrete thinking  Content WDL  Delusions None reported or observed  Perception WDL  Hallucination None reported or observed  Judgment WDL  Confusion None  Danger to Self  Current suicidal ideation? Denies  Self-Injurious Behavior No self-injurious ideation or behavior indicators observed or expressed   Danger to Others  Danger to Others None reported or observed

## 2021-10-09 NOTE — Plan of Care (Signed)
  Problem: Health Behavior/Discharge Planning: Goal: Identification of resources available to assist in meeting health care needs will improve Outcome: Progressing Goal: Compliance with treatment plan for underlying cause of condition will improve Outcome: Progressing   Problem: Physical Regulation: Goal: Ability to maintain clinical measurements within normal limits will improve Outcome: Progressing   

## 2021-10-09 NOTE — Group Note (Signed)
Type of Therapy and Topic: Group Therapy: Gratitude  Participation: Minimal  Description of Group: The purpose behind this group is to get people thinking about things for which  they can be grateful. If continued over time, they might begin to spontaneously look for things and  situations for which to be grateful. Gratitude is related to a "wide variety of forms of wellbeing , whereas "negative attributions" can adversely affect relationships.  Several studies have shown that interventions to  increase gratitude can impact areas such as overall life satisfaction, decreased negative affect, increased happiness, the ability to provide emotional support to others, and decreased worrying.   Therapeutic Goals: Patient will learn activities that focus on gratitude in their daily lives. Patient will share gratitude in their daily lives. Patient will learn to develop healthy habits and positive thinking techniques. Patient will receive support and feedback from others  Therapeutic Modalities: Cognitive Behavioral Therapy Solution Focused Therapy Motivational Interviewing   Kylyn Mcdade, LCSW, LCAS Clincal Social Worker  [facility]    

## 2021-10-09 NOTE — Progress Notes (Addendum)
[facility] MD Progress Note  10/09/2021 6:11 PM [name]  MRN:  [id number] Subjective:  Patient is a 25 year old male with reported history of MDD, borderline personality disorder, anxiety, ADHD presents to [facility] as a walk-in for refractory depression.  Chart Review, 24 hr Events: The patient's chart was reviewed and nursing notes were reviewed. The patient's case was discussed in multidisciplinary team meeting.  Per MAR: - Patient is compliant with scheduled meds. - PRNs: trazodone x 2 Per RN notes, no documented behavioral issues and is attending group. Patient slept, 5.5 hours  TODAY'S INTERVIEW Patient seen and staffed with attending Dr. Mason Jim.  Patient states that he is feeling much better today.  Patient states he feels less anxious and that he had a bowel movement.  Patient states he has been eating appropriately, sleeping well, hydrating appropriately and attending groups.  Patient does complain of some urinary frequency but otherwise has no other physical complaints at this time.  Patient denies dysuria, flank pain, fever.  Patient denies SI/HI/AVH.  Patient is able to contract for safety while on the unit.  Patient denies paranoia, delusions, ideas of reference, first rank symptoms, thought broadcasting.  Principal Problem: MDD (major depressive disorder), recurrent episode, severe (HCC) Diagnosis: Principal Problem:   MDD (major depressive disorder), recurrent episode, severe (HCC) Active Problems:   ADHD (attention deficit hyperactivity disorder), combined type   PTSD (post-traumatic stress disorder)   Suicidal ideation   Substance abuse (HCC)   Nicotine use disorder  Total Time spent with patient:  I personally spent 30 minutes on the unit in direct patient care. The direct patient care time included face-to-face time with the patient, reviewing the patient's chart, communicating with other professionals, and coordinating care. Greater than 50% of this time was  spent in counseling or coordinating care with the patient regarding goals of hospitalization, psycho-education, and discharge planning needs.   Past Psychiatric History: see H&P  Past Medical History:  Past Medical History:  Diagnosis Date   ADHD (attention deficit hyperactivity disorder)    Anxiety    Asthma    Constipation    Deliberate self-cutting    Depression    IBS (irritable bowel syndrome)    Irritable bowel syndrome    Post traumatic stress disorder (PTSD)    Suicide attempt (HCC)    Vision abnormalities    Pt states he wears glasses   History reviewed. No pertinent surgical history. Family History:  Family History  Problem Relation Age of Onset   ADD / ADHD Brother    Hirschsprung's disease Neg Hx    Family Psychiatric  History: see H&P Social History:  Social History   Substance and Sexual Activity  Alcohol Use Not Currently   Comment: Pt states he drinks on occassion     Social History   Substance and Sexual Activity  Drug Use No   Types: Oxycodone   Comment: last used in 7th grade when he "tried it"/used oxycodone last 2015    Social History   Socioeconomic History   Marital status: Single    Spouse name: Not on file   Number of children: Not on file   Years of education: Not on file   Highest education level: Not on file  Occupational History   Not on file  Tobacco Use   Smoking status: Every Day    Packs/day: 1.50    Types: Cigarettes, Cigars   Smokeless tobacco: Never  Vaping Use   Vaping Use: Never used  Substance and  Sexual Activity   Alcohol use: Not Currently    Comment: Pt states he drinks on occassion   Drug use: No    Types: Oxycodone    Comment: last used in 7th grade when he "tried it"/used oxycodone last 2015   Sexual activity: Never  Other Topics Concern   Not on file  Social History Narrative   ** Merged History Encounter **       9th grade         Social Determinants of Health   Financial Resource Strain: Not on  file  Food Insecurity: Not on file  Transportation Needs: Not on file  Physical Activity: Not on file  Stress: Not on file  Social Connections: Not on file   Additional Social History:    Pain Medications: See MAR Prescriptions: SWee MAR Over the Counter: See MAR History of alcohol / drug use?: No history of alcohol / drug abuse                    Sleep: Fair  Appetite:  Good  Current Medications: Current Facility-Administered Medications  Medication Dose Route Frequency Provider Last Rate Last Admin   acetaminophen (TYLENOL) tablet 650 mg  650 mg Oral Q6H PRN Laveda Abbe, NP   650 mg at 10/09/21 0720   acidophilus (RISAQUAD) capsule 2 capsule  2 capsule Oral Daily Park Pope, MD   2 capsule at 10/09/21 0818   alum & mag hydroxide-simeth (MAALOX/MYLANTA) 200-200-20 MG/5ML suspension 30 mL  30 mL Oral Q4H PRN Maryagnes Amos, FNP       hydrOXYzine (ATARAX/VISTARIL) tablet 25 mg  25 mg Oral TID PRN Maryagnes Amos, FNP   25 mg at 10/09/21 1624   magnesium hydroxide (MILK OF MAGNESIA) suspension 30 mL  30 mL Oral Daily PRN Maryagnes Amos, FNP   30 mL at 10/07/21 1450   nicotine (NICODERM CQ - dosed in mg/24 hours) patch 21 mg  21 mg Transdermal Daily Park Pope, MD   21 mg at 10/09/21 0818   polycarbophil (FIBERCON) tablet 625 mg  625 mg Oral BID Maryagnes Amos, FNP   625 mg at 10/09/21 1624   polyethylene glycol (MIRALAX / GLYCOLAX) packet 17 g  17 g Oral Once Mariel Craft, MD       Followed by   polyethylene glycol (MIRALAX / Ethelene Hal) packet 17 g  17 g Oral Daily PRN Mariel Craft, MD   17 g at 10/08/21 1135   traZODone (DESYREL) tablet 100 mg  100 mg Oral QHS Park Pope, MD   100 mg at 10/08/21 2137   venlafaxine XR (EFFEXOR-XR) 24 hr capsule 75 mg  75 mg Oral Q breakfast Park Pope, MD   75 mg at 10/09/21 0818   Vitamin D (Ergocalciferol) (DRISDOL) capsule 50,000 Units  50,000 Units Oral Q7 days Comer Locket, MD         Lab Results:  Results for orders placed or performed during the hospital encounter of 10/05/21 (from the past 48 hour(s))  Basic metabolic panel     Status: Abnormal   Collection Time: 10/08/21  6:27 AM  Result Value Ref Range   Sodium 146 (H) 135 - 145 mmol/L    Comment: DELTA CHECK NOTED   Potassium 4.1 3.5 - 5.1 mmol/L    Comment: DELTA CHECK NOTED   Chloride 110 98 - 111 mmol/L   CO2 29 22 - 32 mmol/L   Glucose, Bld 82 70 -  99 mg/dL    Comment: Glucose reference range applies only to samples taken after fasting for at least 8 hours.   BUN 15 6 - 20 mg/dL   Creatinine, Ser 2.53 0.61 - 1.24 mg/dL   Calcium 66.4 8.9 - 40.3 mg/dL   GFR, Estimated >47 >42 mL/min    Comment: (NOTE) Calculated using the CKD-EPI Creatinine Equation (2021)    Anion gap 7 5 - 15    Comment: Performed at [facility], [address], [place], [place] [address]    Blood Alcohol level:  Lab Results  Component Value Date   [facility] <10 10/06/2021   ETH <5 04/04/2016    Metabolic Disorder Labs: Lab Results  Component Value Date   HGBA1C 4.9 10/06/2021   MPG 93.93 10/06/2021   MPG 114 11/16/2015   Lab Results  Component Value Date   PROLACTIN 6.8 11/16/2015   Lab Results  Component Value Date   CHOL 162 10/06/2021   TRIG 115 10/06/2021   HDL 50 10/06/2021   CHOLHDL 3.2 10/06/2021   VLDL 23 10/06/2021   LDLCALC 89 10/06/2021   LDLCALC 127 (H) 04/08/2016    Physical Findings: AIMS: Facial and Oral Movements Muscles of Facial Expression: None, normal Lips and Perioral Area: None, normal Jaw: None, normal Tongue: None, normal,Extremity Movements Upper (arms, wrists, hands, fingers): None, normal Lower (legs, knees, ankles, toes): None, normal, Trunk Movements Neck, shoulders, hips: None, normal, Overall Severity Severity of abnormal movements (highest score from questions above): None, normal Incapacitation due to abnormal movements: None, normal Patient's awareness  of abnormal movements (rate only patient's report): No Awareness, Dental Status Current problems with teeth and/or dentures?: No Does patient usually wear dentures?: No     Musculoskeletal: Strength & Muscle Tone: within normal limits Gait & Station: normal Patient leans: N/A  Psychiatric Specialty Exam:  Presentation  General Appearance: Appropriate for Environment; Casual  Eye Contact:Fair  Speech:Clear and Coherent; Normal Rate  Speech Volume:Normal  Handedness:Right   Mood and Affect  Mood:described as improved - appears more euthymic  Affect:brighter appearing   Thought Process  Thought Processes:Coherent; Goal Directed; Linear  Descriptions of Associations:Intact  Orientation:Full (Time, Place and Person)  Thought Content:Logical - denies AVH, paranoia or delusions  History of Schizophrenia/Schizoaffective disorder:No  Duration of Psychotic Symptoms:No data recorded Hallucinations:Denied  Ideas of Reference:None  Suicidal Thoughts:Denied  Homicidal Thoughts:Denied   Sensorium  Memory:Immediate Good; Recent Good; Remote Good  Judgment:Fair  Insight:Fair   Executive Functions  Concentration:Good  Attention Span:Good  Recall:Good  Fund of Knowledge:Good  Language:Good   Psychomotor Activity  Psychomotor Activity:Normal   Assets  Assets:Communication Skills; Desire for Improvement; Housing; Health and safety inspector; Physical Health   Sleep  Sleep:5.5 hours  Physical Exam Vitals and nursing note reviewed.  Constitutional:      Appearance: Normal appearance. He is normal weight.  HENT:     Head: Normocephalic and atraumatic.  Pulmonary:     Effort: Pulmonary effort is normal.  Neurological:     General: No focal deficit present.     Mental Status: He is oriented to person, place, and time.   Review of Systems  Respiratory:  Negative for shortness of breath.   Cardiovascular:  Negative for chest pain.   Gastrointestinal:  Negative for abdominal pain, constipation, diarrhea, heartburn, nausea and vomiting.  Neurological:  Negative for headaches.  Blood pressure (!) 139/93, pulse 79, temperature 97.7 F (36.5 C), temperature source Oral, resp. rate 18, height 5\' 6"  (1.676 m), weight  93.2 kg, SpO2 99 %. Body mass index is 33.17 kg/m.   Treatment Plan Summary: Daily contact with patient to assess and evaluate symptoms and progress in treatment and Medication management  ASSESSMENT Patient is a 25 year old male with reported history of MDD, borderline personality disorder, anxiety, ADHD presents to [facility] as a walk-in for refractory depression.  Patient agreeable to a trial of Effexor XR for antidepressant.  Patient able to contract for safety while on the unit.  Patient denies SI/HI/AVH.  Patient appears to be slowly improving in mood.  Patient no longer constipated.  Patient does endorse increased urinary frequency but denies dysuria.  Plan to do a urinalysis to assess for UTI.  Plan for discharge Sunday should mood continue to improve.     PLAN MDD severe, recurrent without psychosis Unspecified anxiety d/o by hx BPD by hx ADHD by hx -Continue Effexor 75 mg daily.     Nicotine Use Disorder -Nicoderm 21 mg patch, will continue to monitor for nicotine cravings   Medical problems Hypokalemia - resolved Potassium 3.3, Klor-con 40 meq, BMP 10/13 K 4.1  Hypernatremia (Na+ 146) - continue to monitor and recheck BMP tomorrow for trending while on SNRI  Low Vitamin D - continue Vitamin D 50,000 IU weekly   IBS Risaquad 2 capsules qd Fibercon tablet 625 mg bid Miralax 17 g daily prn  Abnormal UA (rare bacteria and turbid) Urinary Frequency Repeat UA today, pending  PRN Tylenol for pain Maalox for indigestion Hydroxyzine for anxiety Milk of Magnesia for constipation Trazodone for sleep   3. Safety and Monitoring: Involuntary admission to inpatient psychiatric unit for safety,  stabilization and treatment Daily contact with patient to assess and evaluate symptoms and progress in treatment Patient's case to be discussed in multi-disciplinary team meeting Observation Level : q15 minute checks Vital signs: q12 hours Precautions: suicide, elopement, and assault   4. Discharge Planning: Social work and case management to assist with discharge planning and identification of hospital follow-up needs prior to discharge Estimated LOS: 5-7 days Discharge Concerns: Need to establish a safety plan; Medication compliance and effectiveness Discharge Goals: Return home with outpatient referrals for mental health follow-up including medication management/psychotherapy  Park Pope, MD 10/09/2021, 6:11 PM

## 2021-10-10 DIAGNOSIS — F332 Major depressive disorder, recurrent severe without psychotic features: Secondary | ICD-10-CM | POA: Diagnosis not present

## 2021-10-10 LAB — BASIC METABOLIC PANEL
Anion gap: 9 (ref 5–15)
BUN: 15 mg/dL (ref 6–20)
CO2: 26 mmol/L (ref 22–32)
Calcium: 9.6 mg/dL (ref 8.9–10.3)
Chloride: 102 mmol/L (ref 98–111)
Creatinine, Ser: 1.14 mg/dL (ref 0.61–1.24)
GFR, Estimated: >60 mL/min (ref >60)
Glucose, Bld: 81 mg/dL (ref 70–99)
Potassium: 3.7 mmol/L (ref 3.5–5.1)
Sodium: 137 mmol/L (ref 135–145)

## 2021-10-10 MED ORDER — GABAPENTIN 100 MG PO CAPS
100.0000 mg | ORAL_CAPSULE | Freq: Three times a day (TID) | ORAL | Status: DC
  Administered 2021-10-10 – 2021-10-11 (×5): 100 mg via ORAL
  Filled 2021-10-10 (×13): qty 1

## 2021-10-10 NOTE — Progress Notes (Addendum)
D: Patient appears anxious; he is pleasant with staff and his peers. He did not attend group today. He denies any thoughts of self harm. He does not appears to be responding to internal stimuli.   A: Continue to monitor medication management and MD orders.  Safety checks completed every 15 minutes per protocol.  Offer support and encouragement as needed.  R: Patient is receptive to staff; his behavior is appropriate.     10/10/21 1800  Psych Admission Type (Psych Patients Only)  Admission Status Involuntary  Psychosocial Assessment  Patient Complaints Anxiety  Eye Contact Fair  Facial Expression Flat  Affect Blunted  Speech Logical/coherent  Interaction Assertive  Motor Activity Slow  Appearance/Hygiene Unremarkable  Behavior Characteristics Appropriate to situation  Mood Pleasant  Thought Process  Coherency Circumstantial  Content WDL  Delusions None reported or observed  Perception WDL  Hallucination None reported or observed  Judgment WDL  Confusion None  Danger to Self  Current suicidal ideation? Denies  Danger to Others  Danger to Others None reported or observed

## 2021-10-10 NOTE — Progress Notes (Signed)
Patient has been up and active on the unit, attended group this evening and has voiced no complaints. Patient currently denies having pain, -si/hi/a/v hall. Support and encouragement offered, safety maintained on unit, will continue to monitor.  

## 2021-10-10 NOTE — Group Note (Signed)
Group Topic: Goal Setting  Group Date: 10/10/2021 Start Time: 0900 End Time: 1000 Facilitators: Dione Housekeeper, RN  [facility]  Number of Participants: 13  Group Focus: affirmation, clarity of thought, and goals/reality orientation Treatment Modality:  Psychoeducation Interventions utilized were assignment, group exercise, and support Purpose: To be able to understand and verbalize the reason for their admission to the hospital. To understand that the medication helps with their chemical imbalance but they also need to work on their choices in life. To be challenged to develop a list of 30 positives about themselves. Also introduce the concept that "feelings" are not reality.   Name: Tali Coster Date of Birth: [birth year]  MR: [id number]    Level of Participation: did not attend  Patients Problems:  Patient Active Problem List   Diagnosis Date Noted   Nicotine use disorder 10/06/2021   Severe episode of recurrent major depressive disorder, without psychotic features (HCC)    Major depression 04/06/2016   MDD (major depressive disorder), recurrent episode, severe (HCC) 11/14/2015   Suicidal ideation 09/27/2015   Substance abuse (HCC) 09/27/2015   MDD (major depressive disorder), recurrent severe, without psychosis (HCC) 02/01/2015   PTSD (post-traumatic stress disorder) 11/28/2013   ADHD (attention deficit hyperactivity disorder), combined type 10/16/2013   ODD (oppositional defiant disorder) 10/16/2013   Chronic constipation

## 2021-10-10 NOTE — Progress Notes (Signed)
Adult Psychoeducational Group Note  Date:  10/10/2021 Time:  9:11 PM  Group Topic/Focus:  Wrap-Up Group:   The focus of this group is to help patients review their daily goal of treatment and discuss progress on daily workbooks.  Participation Level:  Active  Participation Quality:  Appropriate  Affect:  Appropriate  Cognitive:  Appropriate  Insight: Appropriate  Engagement in Group:  Engaged  Modes of Intervention:  Exploration  Additional Comments:  Patient attended and participated in group tonight. He reports that today was good. He felt relas, dinner was good and her was in good company  Lita Mains [place] 10/10/2021, 9:11 PM

## 2021-10-10 NOTE — [facility] Group Notes (Signed)
Pt attended morning goals group. 

## 2021-10-10 NOTE — [facility] Group Notes (Signed)
Pt attended afternoon group. 

## 2021-10-10 NOTE — Progress Notes (Signed)
[facility] MD Progress Note  10/10/2021 8:02 AM [name]  MRN:  [id number]  Chief Complaint: depression  Subjective:  [name] is a 25 y.o. male with a history of MDD, borderline personality disorder, anxiety, and ADHD, who was initially admitted for inpatient psychiatric hospitalization on 10/05/2021 for management of worsening depression. The patient is currently on Hospital Day 5.   Chart Review from last 24 hours:  The patient's chart was reviewed and nursing notes were reviewed. The patient's case was discussed in multidisciplinary team meeting. Per nursing, patient attended groups and had no acute behavioral or safety concerns noted. Per [facility] he was compliant with scheduled medications and received Vistaril PRN X3 for anxiety and Tylenol X1 for pain.   Information Obtained Today During Patient Interview: The patient was seen and evaluated on the unit. On assessment today the patient reports that he is feeling anxious and questions if he can have something additional for anxiety or if he can increase his Vistaril. We discussed that the Vistaril may be contributing to constipation and he is therefore willing to start Neurontin for anxiety after r/b/se/a to med were reviewed. He denies AVH, paranoia, SI or HI. He feels his depression is improving. He reports adequate sleep and appetite. He has had some constipation today and was encouraged to use Miralax. He voices no other physical complaints.   Principal Problem: MDD (major depressive disorder), recurrent episode, severe (HCC) Diagnosis: Principal Problem:   MDD (major depressive disorder), recurrent episode, severe (HCC) Active Problems:   ADHD (attention deficit hyperactivity disorder), combined type   PTSD (post-traumatic stress disorder)   Suicidal ideation   Substance abuse (HCC)   Nicotine use disorder  Total Time spent with patient:  I personally spent 25 minutes on the unit in direct patient care. The direct  patient care time included face-to-face time with the patient, reviewing the patient's chart, communicating with other professionals, and coordinating care. Greater than 50% of this time was spent in counseling or coordinating care with the patient regarding goals of hospitalization, psycho-education, and discharge planning needs.   Past Psychiatric History: see H&P  Past Medical History:  Past Medical History:  Diagnosis Date   ADHD (attention deficit hyperactivity disorder)    Anxiety    Asthma    Constipation    Deliberate self-cutting    Depression    IBS (irritable bowel syndrome)    Irritable bowel syndrome    Post traumatic stress disorder (PTSD)    Suicide attempt (HCC)    Vision abnormalities    Pt states he wears glasses   History reviewed. No pertinent surgical history. Family History:  Family History  Problem Relation Age of Onset   ADD / ADHD Brother    Hirschsprung's disease Neg Hx    Family Psychiatric  History: see H&P  Social History:  Social History   Substance and Sexual Activity  Alcohol Use Not Currently   Comment: Pt states he drinks on occassion     Social History   Substance and Sexual Activity  Drug Use No   Types: Oxycodone   Comment: last used in 7th grade when he "tried it"/used oxycodone last 2015    Social History   Socioeconomic History   Marital status: Single    Spouse name: Not on file   Number of children: Not on file   Years of education: Not on file   Highest education level: Not on file  Occupational History   Not on file  Tobacco Use   Smoking status: Every Day    Packs/day: 1.50    Types: Cigarettes, Cigars   Smokeless tobacco: Never  Vaping Use   Vaping Use: Never used  Substance and Sexual Activity   Alcohol use: Not Currently    Comment: Pt states he drinks on occassion   Drug use: No    Types: Oxycodone    Comment: last used in 7th grade when he "tried it"/used oxycodone last 2015   Sexual activity: Never   Other Topics Concern   Not on file  Social History Narrative   ** Merged History Encounter **       9th grade         Social Determinants of Health   Financial Resource Strain: Not on file  Food Insecurity: Not on file  Transportation Needs: Not on file  Physical Activity: Not on file  Stress: Not on file  Social Connections: Not on file   Additional Social History:    Pain Medications: See MAR Prescriptions: SWee MAR Over the Counter: See MAR History of alcohol / drug use?: No history of alcohol / drug abuse  Sleep: Improving   Appetite:  Good  Current Medications: Current Facility-Administered Medications  Medication Dose Route Frequency Provider Last Rate Last Admin   acetaminophen (TYLENOL) tablet 650 mg  650 mg Oral Q6H PRN Laveda Abbe, NP   650 mg at 10/09/21 0720   acidophilus (RISAQUAD) capsule 2 capsule  2 capsule Oral Daily Park Pope, MD   2 capsule at 10/09/21 0818   alum & mag hydroxide-simeth (MAALOX/MYLANTA) 200-200-20 MG/5ML suspension 30 mL  30 mL Oral Q4H PRN Starkes-Perry, Juel Burrow, FNP       gabapentin (NEURONTIN) capsule 100 mg  100 mg Oral TID Bartholomew Crews E, MD       hydrOXYzine (ATARAX/VISTARIL) tablet 25 mg  25 mg Oral TID PRN Maryagnes Amos, FNP   25 mg at 10/09/21 2205   magnesium hydroxide (MILK OF MAGNESIA) suspension 30 mL  30 mL Oral Daily PRN Maryagnes Amos, FNP   30 mL at 10/07/21 1450   nicotine (NICODERM CQ - dosed in mg/24 hours) patch 21 mg  21 mg Transdermal Daily Park Pope, MD   21 mg at 10/09/21 0818   polycarbophil (FIBERCON) tablet 625 mg  625 mg Oral BID Maryagnes Amos, FNP   625 mg at 10/09/21 1624   polyethylene glycol (MIRALAX / GLYCOLAX) packet 17 g  17 g Oral Once Mariel Craft, MD       Followed by   polyethylene glycol (MIRALAX / GLYCOLAX) packet 17 g  17 g Oral Daily PRN Mariel Craft, MD   17 g at 10/08/21 1135   traZODone (DESYREL) tablet 100 mg  100 mg Oral Seabron Spates, MD    100 mg at 10/09/21 2204   venlafaxine XR (EFFEXOR-XR) 24 hr capsule 75 mg  75 mg Oral Q breakfast Park Pope, MD   75 mg at 10/09/21 0818   Vitamin D (Ergocalciferol) (DRISDOL) capsule 50,000 Units  50,000 Units Oral Q7 days Comer Locket, MD        Lab Results:  Results for orders placed or performed during the hospital encounter of 10/05/21 (from the past 48 hour(s))  Urinalysis, Routine w reflex microscopic Urine, Clean Catch     Status: Abnormal   Collection Time: 10/09/21  6:14 PM  Result Value Ref Range   Color, Urine STRAW (A) YELLOW  APPearance CLEAR CLEAR   Specific Gravity, Urine 1.014 1.005 - 1.030   pH 5.0 5.0 - 8.0   Glucose, UA NEGATIVE NEGATIVE mg/dL   Hgb urine dipstick NEGATIVE NEGATIVE   Bilirubin Urine NEGATIVE NEGATIVE   Ketones, ur NEGATIVE NEGATIVE mg/dL   Protein, ur NEGATIVE NEGATIVE mg/dL   Nitrite NEGATIVE NEGATIVE   Leukocytes,Ua NEGATIVE NEGATIVE    Comment: Performed at [facility], [address] [place], [place] [address]    Blood Alcohol level:  Lab Results  Component Value Date   [facility] <10 10/06/2021   ETH <5 04/04/2016    Metabolic Disorder Labs: Lab Results  Component Value Date   HGBA1C 4.9 10/06/2021   MPG 93.93 10/06/2021   MPG 114 11/16/2015   Lab Results  Component Value Date   PROLACTIN 6.8 11/16/2015   Lab Results  Component Value Date   CHOL 162 10/06/2021   TRIG 115 10/06/2021   HDL 50 10/06/2021   CHOLHDL 3.2 10/06/2021   VLDL 23 10/06/2021   LDLCALC 89 10/06/2021   LDLCALC 127 (H) 04/08/2016    Physical Findings: AIMS: Facial and Oral Movements Muscles of Facial Expression: None, normal Lips and Perioral Area: None, normal Jaw: None, normal Tongue: None, normal,Extremity Movements Upper (arms, wrists, hands, fingers): None, normal Lower (legs, knees, ankles, toes): None, normal, Trunk Movements Neck, shoulders, hips: None, normal, Overall Severity Severity of abnormal movements (highest  score from questions above): None, normal Incapacitation due to abnormal movements: None, normal Patient's awareness of abnormal movements (rate only patient's report): No Awareness, Dental Status Current problems with teeth and/or dentures?: No Does patient usually wear dentures?: No     Musculoskeletal: Strength & Muscle Tone: within normal limits Gait & Station: normal Patient leans: N/A Psychiatric Specialty Exam: Physical Exam Vitals reviewed.  HENT:     Head: Normocephalic.  Pulmonary:     Effort: Pulmonary effort is normal.  Neurological:     General: No focal deficit present.     Mental Status: He is alert.    Review of Systems  Respiratory:  Negative for shortness of breath.   Cardiovascular:  Negative for chest pain.  Gastrointestinal:  Positive for constipation. Negative for abdominal pain, diarrhea, nausea and vomiting.  Neurological:  Negative for headaches.   Blood pressure (!) 161/77, pulse (!) 112, temperature (!) 97.5 F (36.4 C), temperature source Oral, resp. rate 18, height 5\' 6"  (1.676 m), weight 93.2 kg, SpO2 97 %.Body mass index is 33.17 kg/m.  General Appearance:  casually dressed, adequate hygiene  Eye Contact:  Fair  Speech:  Clear and Coherent and Normal Rate  Volume:  Normal  Mood:  Anxious  Affect:  Congruent  Thought Process:  Goal Directed and Linear  Orientation:  Full (Time, Place, and Person)  Thought Content:  Logical and no evidence of acute psychosis, delusions or paranoia  Suicidal Thoughts:  No  Homicidal Thoughts:  No  Memory:  Recent;   Good  Judgement:  Fair  Insight:  Fair  Psychomotor Activity:  Normal  Concentration:  Concentration: Good and Attention Span: Good  Recall:  Good  Fund of Knowledge:  Good  Language:  Good  Akathisia:  Negative  Assets:  Communication Skills Desire for Improvement Housing Resilience Social Support  ADL's:  Intact  Cognition:  WNL  Sleep:  Number of Hours: 6.25   Treatment Plan  Summary: Daily contact with patient to assess and evaluate symptoms and progress in treatment and Medication management  Assessment  Patient is a 25 year old male with reported history of MDD, borderline personality disorder, anxiety, ADHD presents to [facility] as a walk-in for refractory depression.  Patient agreeable to a trial of Effexor XR for antidepressant. Patient appears to be slowly improving in mood but reports increased anxiety today. He agrees to trial of Neurontin and continued PRN Vistaril for anxiety.  Plan for discharge Sunday should mood continue to improve.   Plan Psychiatric Diagnoses MDD severe, recurrent without psychosis Unspecified anxiety d/o by hx BPD by hx ADHD by hx -Continue Effexor 75 mg daily monitoring BP  - Start Neurontin 100mg  tid for anxiety (r/b/se/a to med discussed and he consents to med trial) - Continue Vistaril 25mg  tid PRN anxiety (not increasing due to recent constipation)   Nicotine Use Disorder -Nicoderm 21 mg patch, will continue to monitor for nicotine cravings   Medical problems Hypokalemia - resolved - Potassium 3.3, Klor-con 40 meq, BMP 10/13 K 4.1  Hypernatremia (Na+ 146) - continue to monitor and recheck BMP today pending  Low Vitamin D - continue Vitamin D 50,000 IU weekly   IBS/constipation -Risaquad 2 capsules qd -Fibercon tablet 625 mg bid -Miralax 17 g daily prn  Abnormal UA (rare bacteria and turbid) with Urinary Frequency - repeat UA 10/14 WNL except for straw coloration - encouraged to f/u with PCP after discharge  PRN Tylenol for pain Maalox for indigestion Hydroxyzine for anxiety Milk of Magnesia for constipation Trazodone for sleep   3. Safety and Monitoring: Involuntary admission to inpatient psychiatric unit for safety, stabilization and treatment Daily contact with patient to assess and evaluate symptoms and progress in treatment Patient's case to be discussed in multi-disciplinary team meeting Observation  Level : q15 minute checks Vital signs: q12 hours Precautions: suicide, elopement, and assault   4. Discharge Planning: Social work and case management to assist with discharge planning and identification of hospital follow-up needs prior to discharge Discharge Concerns: Need to establish a safety plan; Medication compliance and effectiveness Discharge Goals: Return home with outpatient referrals for mental health follow-up including medication management/psychotherapy  11/13, MD, FAPA 10/10/2021, 8:02 AM

## 2021-10-11 DIAGNOSIS — E559 Vitamin D deficiency, unspecified: Secondary | ICD-10-CM | POA: Diagnosis present

## 2021-10-11 MED ORDER — VENLAFAXINE HCL ER 75 MG PO CP24: 75.0000 mg | ORAL_CAPSULE | Freq: Every day | ORAL | 0 refills | Status: DC

## 2021-10-11 MED ORDER — TRAZODONE HCL 100 MG PO TABS: 100.0000 mg | ORAL_TABLET | Freq: Every day | ORAL | 0 refills | Status: DC

## 2021-10-11 MED ORDER — HYDROXYZINE HCL 25 MG PO TABS: 25.0000 mg | ORAL_TABLET | Freq: Three times a day (TID) | ORAL | 0 refills | Status: DC | PRN

## 2021-10-11 MED ORDER — GABAPENTIN 100 MG PO CAPS: 100.0000 mg | ORAL_CAPSULE | Freq: Three times a day (TID) | ORAL | 0 refills | Status: DC

## 2021-10-11 MED ORDER — NICOTINE 21 MG/24HR TD PT24: 21.0000 mg | MEDICATED_PATCH | Freq: Every day | TRANSDERMAL | 0 refills | Status: AC

## 2021-10-11 NOTE — Progress Notes (Signed)
  [facility] Adult Case Management Discharge Plan :  Will you be returning to the same living situation after discharge:  Yes,  to apartment At discharge, do you have transportation home?: Yes,  patient stated he can arrange this Do you have the ability to pay for your medications: Yes,  patient has income and Medicaid  Release of information consent forms completed and emailed to Medical Records, then turned in to Medical Records by CSW.   Patient to Follow up at:  Follow-up Information     [facility] Follow up.   Specialty: Behavioral Health Why: Please go to this provider for therapy and medication management services during walk in hours:  Monday through Wednesday from 7:45 am to 11:00 am.  Services are provided on a first come, first served basis.  Please arrive early. Contact information: [phone] [place] [address] [phone]        [facility]. Call.   Why: Please use this resource to join virtual groups. Contact information: Website: [email or web address]  [facility] HelpLine (M - F, 10 a.m.-10 p.m., ET.) 800-950-NAMI (6264)        [employer]. Call.   Why: Please use this resource to join [facility] Groups in person. Contact information: Address: [address], [place], [place] [address]  Phone: [phone]                Next level of care provider has access to [facility] Link:yes  Safety Planning and Suicide Prevention discussed: Yes,  with mother     Has patient been referred to the Quitline?: N/A patient is not a smoker  Patient has been referred for addiction treatment: N/A  Lynnell Chad, LCSW 10/11/2021, 9:40 AM

## 2021-10-11 NOTE — Progress Notes (Signed)
   10/11/21 0800  Psych Admission Type (Psych Patients Only)  Admission Status Voluntary  Psychosocial Assessment  Patient Complaints Anxiety  Eye Contact Fair  Facial Expression Animated  Affect Appropriate to circumstance  Speech Logical/coherent  Interaction Assertive  Motor Activity Other (Comment) (wnl)  Appearance/Hygiene Unremarkable  Behavior Characteristics Anxious  Mood Pleasant  Thought Process  Coherency Circumstantial  Content WDL  Delusions None reported or observed  Perception WDL  Hallucination None reported or observed  Judgment WDL  Confusion None  Danger to Self  Current suicidal ideation? Denies  Self-Injurious Behavior No self-injurious ideation or behavior indicators observed or expressed   Agreement Not to Harm Self Yes  Description of Agreement verbal  Danger to Others  Danger to Others None reported or observed
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
## 2021-10-11 NOTE — B[facility] Group Notes (Signed)
  [facility] LCSW Group Therapy Note  Date/Time:  10/11/2021 10:00AM-11:00AM  Type of Therapy and Topic:  Group Therapy:  Self-Care Wheel  Participation Level:  Active   Description of Group This process group involved patients discussing the importance of self-care in different areas of life (professional, personal, emotional, psychological, spiritual, and physical) in order to achieve healthy life balance.  The group talked about what self-care in each of those areas would constitute and then specifically listed how they want to provide themselves with improved self-care.  Therapeutic Goals Patient will learn how to break self-care down into various areas of life Patient will participate in generating ideas about healthy self-care options in each category Patients will be supportive of one another and receive support from others Patient will identify one healthy self-care activity to add to his/her life   Summary of Patient Progress:  The patient expressed that one thing he/she can share about him/herself is that that he enjoys playing guitar.  Because others in group expressed interest in music, this helped him/her to see how people are all connected in a variety of ways.  During the discussion about self-care, he/she shared frequently.  He/She was able to identify ways in which self-care in different areas would be helpful.  Patient's insight was good.  Therapeutic Modalities Processing Psychoeducation   Ambrose Mantle, LCSW 10/11/2021 2:27 PM

## 2021-10-11 NOTE — Plan of Care (Signed)
Patient discharged per MD order. Patient states he will call an uber for transportation. He denies any thoughts of self harm/AVH/HI. Reviewed AVS with patient and he indicates he understands his follow up appointments.
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
## 2021-10-11 NOTE — Group Note (Signed)
Group Topic: PROGRESSIVE RELAXATION. A group where deep breathing is taught and tensing and relaxation muscle groups is used. Imagery is used as well.  Pts are asked to imagine 3 pillars that hold them up when they are not able to hold themselves up. Group Date: 10/11/2021 Start Time: 0900 End Time: 1000 Facilitators: Dione Housekeeper, RN  Department: [facility] INPATIENT ADULT 300B  Number of Participants: 16  Group Focus: anxiety and clarity of thought Treatment Modality:  Cognitive Behavioral Therapy, Skills Training, and Spiritual Interventions utilized were exploration and support Purpose: enhance coping skills and reinforce self-care  Name: [name] Date of Birth: September 16, 1996  MR: [id number]    Level of Participation: active Quality of Participation: cooperative Interactions with others: gave feedback Mood/Affect: bright Triggers (if applicable):  Cognition: logical Progress: Significant Response: Pt rates his energy at a 10. States his family, music and friendship are his pillars Plan: patient will be encouraged to Pt. To follow up with out Pt. services  Patients Problems:  Patient Active Problem List   Diagnosis Date Noted   Vitamin D deficiency 10/11/2021   Nicotine use disorder 10/06/2021   Severe episode of recurrent major depressive disorder, without psychotic features (HCC)    Major depression 04/06/2016   MDD (major depressive disorder), recurrent episode, severe (HCC) 11/14/2015   Substance abuse (HCC) 09/27/2015   MDD (major depressive disorder), recurrent severe, without psychosis (HCC) 02/01/2015   PTSD (post-traumatic stress disorder) 11/28/2013   ADHD (attention deficit hyperactivity disorder), combined type 10/16/2013   ODD (oppositional defiant disorder) 10/16/2013   Chronic constipation

## 2021-10-11 NOTE — BHH Group Notes (Signed)
The focus of this group is to help patients establish daily goals to achieve during treatment and discuss how the patient can incorporate goal setting into their daily lives to aide in recovery.  Pt did not attend morning goals group.  

## 2021-10-11 NOTE — Group Note (Signed)
Late Note for 10/10/2021  Clinical Social Work Note  No group could be held this day due to low staffing.  Other licensed group was held.  Coralyn Roselli Grossman-Orr, LCSW 10/10/2021    

## 2021-10-11 NOTE — Progress Notes (Signed)
Patient ID: [name], male   DOB: [birth year], 25 y.o.   MRN: [id number] Patient discharged home. Reviewed AVS with patient; he understood instructions. He will pick his prescriptions up from [facility]. Patient denies any thoughts of self harm/HI/AVH. Patient received all his belongings from his room and locker. He was discharged to lobby with his cell to call an uber for transportation.

## 2022-10-04 ENCOUNTER — Ambulatory Visit (HOSPITAL_COMMUNITY)
Admission: EM | Admit: 2022-10-04 | Discharge: 2022-10-05 | Disposition: A | Discharge status: Other Acute Inpatient Hospital | Payer: Medicaid Other | Attending: Psychiatry | Admitting: Psychiatry

## 2022-10-04 DIAGNOSIS — Z1152 Encounter for screening for COVID-19: Secondary | ICD-10-CM | POA: Diagnosis not present

## 2022-10-04 DIAGNOSIS — F10129 Alcohol abuse with intoxication, unspecified: Secondary | ICD-10-CM | POA: Diagnosis not present

## 2022-10-04 DIAGNOSIS — F332 Major depressive disorder, recurrent severe without psychotic features: Secondary | ICD-10-CM | POA: Diagnosis not present

## 2022-10-04 LAB — COMPREHENSIVE METABOLIC PANEL
ALT: 46 U/L — ABNORMAL HIGH (ref 0–44)
AST: 30 U/L (ref 15–41)
Albumin: 4.6 g/dL (ref 3.5–5.0)
Alkaline Phosphatase: 85 U/L (ref 38–126)
Anion gap: 12 (ref 5–15)
BUN: 10 mg/dL (ref 6–20)
CO2: 23 mmol/L (ref 22–32)
Calcium: 9.6 mg/dL (ref 8.9–10.3)
Chloride: 106 mmol/L (ref 98–111)
Creatinine, Ser: 1.08 mg/dL (ref 0.61–1.24)
GFR, Estimated: >60 mL/min (ref >60)
Glucose, Bld: 81 mg/dL (ref 70–99)
Potassium: 4.6 mmol/L (ref 3.5–5.1)
Sodium: 141 mmol/L (ref 135–145)
Total Bilirubin: 0.8 mg/dL (ref 0.3–1.2)
Total Protein: 8.3 g/dL — ABNORMAL HIGH (ref 6.5–8.1)

## 2022-10-04 LAB — CBC WITH DIFFERENTIAL/PLATELET
Abs Immature Granulocytes: 0.02 10*3/uL (ref 0.00–0.07)
Basophils Absolute: 0 10*3/uL (ref 0.0–0.1)
Basophils Relative: 1 %
Eosinophils Absolute: 0.1 10*3/uL (ref 0.0–0.5)
Eosinophils Relative: 2 %
HCT: 50.6 % (ref 39.0–52.0)
Hemoglobin: 17.9 g/dL — ABNORMAL HIGH (ref 13.0–17.0)
Immature Granulocytes: 0 %
Lymphocytes Relative: 38 %
Lymphs Abs: 2.3 10*3/uL (ref 0.7–4.0)
MCH: 30.9 pg (ref 26.0–34.0)
MCHC: 35.4 g/dL (ref 30.0–36.0)
MCV: 87.2 fL (ref 80.0–100.0)
Monocytes Absolute: 0.5 10*3/uL (ref 0.1–1.0)
Monocytes Relative: 9 %
Neutro Abs: 3 10*3/uL (ref 1.7–7.7)
Neutrophils Relative %: 50 %
Platelets: 246 10*3/uL (ref 150–400)
RBC: 5.8 MIL/uL (ref 4.22–5.81)
RDW: 12.9 % (ref 11.5–15.5)
WBC: 5.9 10*3/uL (ref 4.0–10.5)
nRBC: 0 % (ref 0.0–0.2)

## 2022-10-04 LAB — POCT URINE DRUG SCREEN - MANUAL ENTRY (I-SCREEN)
POC Amphetamine UR: NOT DETECTED
POC Buprenorphine (BUP): NOT DETECTED
POC Cocaine UR: NOT DETECTED
POC Marijuana UR: POSITIVE — AB
POC Methadone UR: NOT DETECTED
POC Methamphetamine UR: NOT DETECTED
POC Morphine: NOT DETECTED
POC Oxazepam (BZO): NOT DETECTED
POC Oxycodone UR: NOT DETECTED
POC Secobarbital (BAR): NOT DETECTED

## 2022-10-04 LAB — LIPID PANEL
Cholesterol: 226 mg/dL — ABNORMAL HIGH (ref 0–200)
HDL: 68 mg/dL (ref >40)
LDL Cholesterol: 135 mg/dL — ABNORMAL HIGH (ref 0–99)
Total CHOL/HDL Ratio: 3.3 RATIO
Triglycerides: 114 mg/dL (ref <150)
VLDL: 23 mg/dL (ref 0–40)

## 2022-10-04 LAB — MAGNESIUM: Magnesium: 2.2 mg/dL (ref 1.7–2.4)

## 2022-10-04 LAB — TSH: TSH: 0.428 u[IU]/mL (ref 0.350–4.500)

## 2022-10-04 LAB — HEMOGLOBIN A1C
Hgb A1c MFr Bld: 4.8 % (ref 4.8–5.6)
Mean Plasma Glucose: 91.06 mg/dL

## 2022-10-04 LAB — RESP PANEL BY RT-PCR (FLU A&B, COVID) ARPGX2
Influenza A by PCR: NEGATIVE
Influenza B by PCR: NEGATIVE
SARS Coronavirus 2 by RT PCR: NEGATIVE

## 2022-10-04 LAB — ETHANOL: Alcohol, Ethyl (B): 242 mg/dL — ABNORMAL HIGH (ref <10)

## 2022-10-04 LAB — POC SARS CORONAVIRUS 2 AG -  ED: SARS Coronavirus 2 Ag: NEGATIVE

## 2022-10-04 MED ORDER — ACETAMINOPHEN 325 MG PO TABS: 650.0000 mg | ORAL_TABLET | Freq: Four times a day (QID) | ORAL | Status: DC | PRN

## 2022-10-04 MED ORDER — MAGNESIUM HYDROXIDE 400 MG/5ML PO SUSP: 30.0000 mL | Freq: Every day | ORAL | Status: DC | PRN

## 2022-10-04 MED ORDER — THIAMINE HCL 100 MG/ML IJ SOLN
100.0000 mg | Freq: Once | INTRAMUSCULAR | Status: DC
  Filled 2022-10-04: qty 2

## 2022-10-04 MED ORDER — THIAMINE MONONITRATE 100 MG PO TABS
100.0000 mg | ORAL_TABLET | Freq: Every day | ORAL | Status: DC
  Administered 2022-10-05: 100 mg via ORAL
  Filled 2022-10-04: qty 1

## 2022-10-04 MED ORDER — ADULT MULTIVITAMIN W/MINERALS CH
1.0000 | ORAL_TABLET | Freq: Every day | ORAL | Status: DC
  Administered 2022-10-05: 1 via ORAL
  Filled 2022-10-04 (×2): qty 1

## 2022-10-04 MED ORDER — ALUM & MAG HYDROXIDE-SIMETH 200-200-20 MG/5ML PO SUSP: 30.0000 mL | ORAL | Status: DC | PRN

## 2022-10-04 MED ORDER — HYDROXYZINE HCL 25 MG PO TABS
25.0000 mg | ORAL_TABLET | Freq: Four times a day (QID) | ORAL | Status: DC | PRN
  Administered 2022-10-05: 25 mg via ORAL
  Filled 2022-10-04: qty 1

## 2022-10-04 MED ORDER — ONDANSETRON 4 MG PO TBDP: 4.0000 mg | ORAL_TABLET | Freq: Four times a day (QID) | ORAL | Status: DC | PRN

## 2022-10-04 MED ORDER — LOPERAMIDE HCL 2 MG PO CAPS: 2.0000 mg | ORAL_CAPSULE | ORAL | Status: DC | PRN

## 2022-10-04 MED ORDER — TRAZODONE HCL 50 MG PO TABS
50.0000 mg | ORAL_TABLET | Freq: Every evening | ORAL | Status: DC | PRN
  Administered 2022-10-05: 50 mg via ORAL
  Filled 2022-10-04: qty 1

## 2022-10-04 MED ORDER — LORAZEPAM 1 MG PO TABS
1.0000 mg | ORAL_TABLET | Freq: Four times a day (QID) | ORAL | Status: DC | PRN
  Administered 2022-10-05: 1 mg via ORAL
  Filled 2022-10-04: qty 1

## 2022-10-04 NOTE — ED Notes (Signed)
Pt. Awake, laying quietly  in recliner. Will continue to monitor for safety. No resp distress noted.
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
## 2022-10-04 NOTE — Progress Notes (Signed)
Pt refused his scheduled Thiamine and Multivitamins.

## 2022-10-04 NOTE — ED Notes (Signed)
Pt. Awake, alert, oriented x 4. He is slightly agitated with current situation. States "this is not what I expected when I came here for help". He denies SI/HI/AVH.  States he has had an increase in depression lately. Will continue to monitor for safety. Hyper fixated on d/c.

## 2022-10-04 NOTE — Progress Notes (Signed)
Pt admitted to Continuous Observation due SI, depression and ETOH abuse. Pt currently denies and verbally contracts for safety. Pt is alert and oriented with tearful affect. Pt is very labile and easily agitated. Pt needed multiple redirections before he complied with admission process. Pt is ambulatory and is oriented to staff/unit. Pt denies current HI/AVH.  Staff will monitor for pt's safety.

## 2022-10-04 NOTE — ED Provider Notes (Signed)
[facility] Continuous Assessment Admission H&P  Date: 10/04/22 Patient Name: [name] MRN: [id number] Chief Complaint:  Chief Complaint  Patient presents with   Depression   Suicidal      Diagnoses:  Final diagnoses:  MDD (major depressive disorder), recurrent severe, without psychosis (HCC)    HPI: 31 [name] 26 y.o., male patient presented to [facility] as a walk-in voluntarily with complaints of increased depression, SI, and alcohol intoxication.  [facility] [place], 26 y.o., male patient seen face to face by this provider, consulted with Dr. Lucianne Muss; and chart reviewed on 10/04/22.  Per chart review patient has a past psychiatric history of MDD, ADHD, ODD, borderline personality disorder, anxiety substance abuse, PTSD.  He currently has no outpatient psychiatric services in place and does not take any medications.  He reports past psychiatric hospitalizations with his last hospitalization on 09/2021 at [facility] behavioral health.  He reports roughly 5 suicide attempts in the past.  During evaluation [name] is observed laying in the assessment room on the bench.  He is initially hard to arouse.  While awaking and trying to sit up on the bench he fell off into the floor.  He denies any injuries and states he is "okay".  He is alert/oriented x4, cooperative, and inattentive at times.  His speech is slow and slurred.  He appears intoxicated.  He is tearful at times throughout the assessment.  He reports over the past few months he has had an increase in his depression, he also started drinking roughly every day around this time.  He drinks liquor or wine daily.  He denies all other substance use.  He denies any history of alcohol withdrawal seizure or DTs.  Reports he bought a bottle of white liquor (vodka) yesterday and he has consumed the entire bottle with his last drink being today before he presented for assessment.  states, "I have good friends and they  brought me here because they were worried about me".  States he was feeling suicidal today and had contemplated taking medications as an overdose but he has friends talked him out of it.  He denies any current intent but cannot contract for safety.  Reports over the past couple months he has felt suicidal but it intensified today.  He endorses increased depression with feelings of helplessness, hopelessness, worthlessness, decreased motivation and guilt.  He quit his job roughly 2 weeks ago.  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  Reports he has no one that he can depend on.  He denies HI/AVH.  Objectively he does not appear to be responding to internal/external stimuli.  Discussed admission to the continuous assessment unit for overnight observation with a reassessment by psychiatry in the a.m.  Explained the milieu and expectations including lab work, COVID testing, and EKG.  Patient agreed.  PHQ 2-9:   Flowsheet Row Admission (Discharged) from [facility]  C-SSRS RISK CATEGORY High Risk        Total Time spent with patient: 30 minutes  Musculoskeletal  Strength & Muscle Tone: within normal limits Gait & Station: unsteady- patient appears intoxicated  Patient leans: N/A  Psychiatric Specialty Exam  Presentation General Appearance:  Casual  Eye Contact: Fair  Speech: Slow; Slurred  Speech Volume: Decreased  Handedness: Right   Mood and Affect  Mood: Depressed; Anxious; Hopeless  Affect: Congruent   Thought Process  Thought Processes: Coherent  Descriptions of Associations:Intact  Orientation:Full (Time, Place and Person)  Thought Content:Logical  Diagnosis of Schizophrenia or Schizoaffective disorder in past: No   Hallucinations:Hallucinations: None  Ideas of Reference:None  Suicidal Thoughts:Suicidal Thoughts: Yes, Passive SI Passive Intent and/or Plan: Without Intent; With Plan; With  Access to Means  Homicidal Thoughts:Homicidal Thoughts: No   Sensorium  Memory: Immediate Fair; Recent Fair; Remote Fair  Judgment: Poor  Insight: Poor   Executive Functions  Concentration: Fair  Attention Span: Fair  Recall: [employer] of Knowledge: Fair  Language: Fair   Psychomotor Activity  Psychomotor Activity: Psychomotor Activity: Normal   Assets  Assets: Physical Health; Resilience; Social Support; Housing; Leisure Time   Sleep  Sleep: Sleep: Poor   Nutritional Assessment (For OBS and FBC admissions only) Has the patient had a weight loss or gain of 10 pounds or more in the last 3 months?: No Has the patient had a decrease in food intake/or appetite?: Yes Does the patient have dental problems?: No Does the patient have eating habits or behaviors that may be indicators of an eating disorder including binging or inducing vomiting?: Yes Has the patient recently lost weight without trying?: 2.0 Has the patient been eating poorly because of a decreased appetite?: 1 Malnutrition Screening Tool Score: 3    Physical Exam Vitals and nursing note reviewed.  Constitutional:      General: He is not in acute distress.    Appearance: He is well-developed.  HENT:     Head: Normocephalic and atraumatic.  Eyes:     General:        Right eye: No discharge.        Left eye: No discharge.     Conjunctiva/sclera: Conjunctivae normal.  Cardiovascular:     Rate and Rhythm: Regular rhythm. Tachycardia present.     Heart sounds: No murmur heard. Pulmonary:     Effort: Pulmonary effort is normal. No respiratory distress.     Breath sounds: Normal breath sounds.  Abdominal:     Palpations: Abdomen is soft.     Tenderness: There is no abdominal tenderness.  Musculoskeletal:        General: No swelling. Normal range of motion.     Cervical back: Normal range of motion and neck supple.  Skin:    General: Skin is warm and dry.     Capillary Refill:  Capillary refill takes less than 2 seconds.     Coloration: Skin is not jaundiced or pale.  Neurological:     Mental Status: He is alert and oriented to person, place, and time.  Psychiatric:        Attention and Perception: Attention and perception normal.        Mood and Affect: Mood is anxious and depressed. Affect is tearful.        Speech: Speech is slurred.        Behavior: Behavior is cooperative.        Thought Content: Thought content includes suicidal ideation. Thought content does not include suicidal plan.        Cognition and Memory: Cognition normal.        Judgment: Judgment is impulsive.    Review of Systems  Constitutional: Negative.   HENT: Negative.    Eyes: Negative.   Respiratory: Negative.    Cardiovascular: Negative.   Musculoskeletal: Negative.   Skin: Negative.   Neurological: Negative.   Psychiatric/Behavioral:  Positive for depression, substance abuse and suicidal ideas. The patient is nervous/anxious.     Blood  pressure 132/88, pulse (!) 110, temperature 98.3 F (36.8 C), resp. rate 18, height  (1.651 m), weight 185 lb (83.9 kg), SpO2 97 %. Body mass index is 30.79 kg/m.  Past Psychiatric History: PTSD, MDD, ADHD, ODD, substance abuse, borderline personality disorder and anxiety  Is the patient at risk to self? Yes  Has the patient been a risk to self in the past 6 months? Yes .    Has the patient been a risk to self within the distant past? Yes   Is the patient a risk to others? No   Has the patient been a risk to others in the past 6 months? No   Has the patient been a risk to others within the distant past? No   Past Medical History:  Past Medical History:  Diagnosis Date   ADHD (attention deficit hyperactivity disorder)    Anxiety    Asthma    Constipation    Deliberate self-cutting    Depression    IBS (irritable bowel syndrome)    Irritable bowel syndrome    Post traumatic stress disorder (PTSD)    Suicide attempt (HCC)     Vision abnormalities    Pt states he wears glasses   No past surgical history on file.  Family History:  Family History  Problem Relation Age of Onset   ADD / ADHD Brother    Hirschsprung's disease Neg Hx     Social History:  Social History   Socioeconomic History   Marital status: Single    Spouse name: Not on file   Number of children: Not on file   Years of education: Not on file   Highest education level: Not on file  Occupational History   Not on file  Tobacco Use   Smoking status: Every Day    Packs/day: 1.50    Types: Cigarettes, Cigars   Smokeless tobacco: Never  Vaping Use   Vaping Use: Never used  Substance and Sexual Activity   Alcohol use: Not Currently    Comment: Pt states he drinks on occassion   Drug use: No    Types: Oxycodone    Comment: last used in 7th grade when he "tried it"/used oxycodone last 2015   Sexual activity: Never  Other Topics Concern   Not on file  Social History Narrative   ** Merged History Encounter **       9th grade         Social Determinants of Health   Financial Resource Strain: Not on file  Food Insecurity: Not on file  Transportation Needs: Not on file  Physical Activity: Not on file  Stress: Not on file  Social Connections: Not on file  Intimate Partner Violence: Not on file    SDOH:  SDOH Screenings   Alcohol Screen: Low Risk  (10/05/2021)  Tobacco Use: High Risk (10/05/2021)    Last Labs:  No visits with results within 6 Month(s) from this visit.  Latest known visit with results is:  Admission on 10/05/2021, Discharged on 10/11/2021  Component Date Value Ref Range Status   SARS Coronavirus 2 by RT PCR 10/05/2021 NEGATIVE  NEGATIVE Final   Comment: (NOTE) SARS-CoV-2 target nucleic acids are NOT DETECTED.  The SARS-CoV-2 RNA is generally detectable in upper respiratory specimens during the acute phase of infection. The lowest concentration of SARS-CoV-2 viral copies this assay can detect is 138  copies/mL. A negative result does not preclude SARS-Cov-2 infection and should not be used as the  sole basis for treatment or other patient management decisions. A negative result may occur with  improper specimen collection/handling, submission of specimen other than nasopharyngeal swab, presence of viral mutation(s) within the areas targeted by this assay, and inadequate number of viral copies(<138 copies/mL). A negative result must be combined with clinical observations, patient history, and epidemiological information. The expected result is Negative.  Fact Sheet for Patients:  EntrepreneurPulse.com.au  Fact Sheet for Healthcare Providers:  IncredibleEmployment.be  This test is no                          t yet approved or cleared by the [place] FDA and  has been authorized for detection and/or diagnosis of SARS-CoV-2 by FDA under an Emergency Use Authorization (EUA). This EUA will remain  in effect (meaning this test can be used) for the duration of the COVID-19 declaration under Section 564(b)(1) of the Act, 21 U.S.C.section 360bbb-3(b)(1), unless the authorization is terminated  or revoked sooner.       Influenza A by PCR 10/05/2021 NEGATIVE  NEGATIVE Final   Influenza B by PCR 10/05/2021 NEGATIVE  NEGATIVE Final   Comment: (NOTE) The Xpert Xpress SARS-CoV-2/FLU/RSV plus assay is intended as an aid in the diagnosis of influenza from Nasopharyngeal swab specimens and should not be used as a sole basis for treatment. Nasal washings and aspirates are unacceptable for Xpert Xpress SARS-CoV-2/FLU/RSV testing.  Fact Sheet for Patients: EntrepreneurPulse.com.au  Fact Sheet for Healthcare Providers: IncredibleEmployment.be  This test is not yet approved or cleared by the [place] FDA and has been authorized for detection and/or diagnosis of SARS-CoV-2 by FDA under an Emergency Use Authorization  (EUA). This EUA will remain in effect (meaning this test can be used) for the duration of the COVID-19 declaration under Section 564(b)(1) of the Act, 21 U.S.C. section 360bbb-3(b)(1), unless the authorization is terminated or revoked.  Performed at [facility], [place] [address] [place], [place] [address]    Color, Urine 10/06/2021 YELLOW  YELLOW Final   APPearance 10/06/2021 TURBID (A)  CLEAR Final   Specific Gravity, Urine 10/06/2021 1.025  1.005 - 1.030 Final   pH 10/06/2021 5.0  5.0 - 8.0 Final   Glucose, UA 10/06/2021 NEGATIVE  NEGATIVE mg/dL Final   Hgb urine dipstick 10/06/2021 NEGATIVE  NEGATIVE Final   Bilirubin Urine 10/06/2021 NEGATIVE  NEGATIVE Final   Ketones, ur 10/06/2021 NEGATIVE  NEGATIVE mg/dL Final   Protein, ur 10/06/2021 NEGATIVE  NEGATIVE mg/dL Final   Nitrite 10/06/2021 NEGATIVE  NEGATIVE Final   Leukocytes,Ua 10/06/2021 NEGATIVE  NEGATIVE Final   RBC / HPF 10/06/2021 0-5  0 - 5 RBC/hpf Final   WBC, UA 10/06/2021 0-5  0 - 5 WBC/hpf Final   Bacteria, UA 10/06/2021 RARE (A)  NONE SEEN Final   Mucus 10/06/2021 PRESENT   Final   Ca Oxalate Crys, UA 10/06/2021 PRESENT   Final   Performed at [facility], [place] [address] [place], [place] [address]   WBC 10/06/2021 5.9  4.0 - 10.5 K/uL Final   RBC 10/06/2021 5.34  4.22 - 5.81 MIL/uL Final   Hemoglobin 10/06/2021 16.8  13.0 - 17.0 g/dL Final   HCT 10/06/2021 48.3  39.0 - 52.0 % Final   MCV 10/06/2021 90.4  80.0 - 100.0 fL Final   MCH 10/06/2021 31.5  26.0 - 34.0 pg Final   MCHC 10/06/2021 34.8  30.0 - 36.0 g/dL Final   RDW 10/06/2021  12.6  11.5 - 15.5 % Final   Platelets 10/06/2021 207  150 - 400 K/uL Final   nRBC 10/06/2021 0.0  0.0 - 0.2 % Final   Performed at [facility], [address], [place], [place] [address]   Sodium 10/06/2021 138  135 - 145 mmol/L Final   Potassium 10/06/2021 3.3 (L)  3.5 - 5.1 mmol/L Final   Chloride 10/06/2021 103  98 - 111  mmol/L Final   CO2 10/06/2021 28  22 - 32 mmol/L Final   Glucose, Bld 10/06/2021 98  70 - 99 mg/dL Final   Glucose reference range applies only to samples taken after fasting for at least 8 hours.   BUN 10/06/2021 18  6 - 20 mg/dL Final   Creatinine, Ser 10/06/2021 0.89  0.61 - 1.24 mg/dL Final   Calcium 86/76/7209 9.7  8.9 - 10.3 mg/dL Final   Total Protein 47/08/6282 7.5  6.5 - 8.1 g/dL Final   Albumin 66/29/4765 4.6  3.5 - 5.0 g/dL Final   AST 46/50/3546 23  15 - 41 U/L Final   ALT 10/06/2021 27  0 - 44 U/L Final   Alkaline Phosphatase 10/06/2021 79  38 - 126 U/L Final   Total Bilirubin 10/06/2021 0.7  0.3 - 1.2 mg/dL Final   GFR, Estimated 10/06/2021 >60  >60 mL/min Final   Comment: (NOTE) Calculated using the CKD-EPI Creatinine Equation (2021)    Anion gap 10/06/2021 7  5 - 15 Final   Performed at [facility], [address], [place], [place] [address]   Alcohol, Ethyl (B) 10/06/2021 <10  <10 mg/dL Final   Comment: (NOTE) Lowest detectable limit for serum alcohol is 10 mg/dL.  For medical purposes only. Performed at [facility], [address] [place], [place] [address]    Hgb A1c MFr Bld 10/06/2021 4.9  4.8 - 5.6 % Final   Comment: (NOTE) Pre diabetes:          5.7%-6.4%  Diabetes:              >6.4%  Glycemic control for   <7.0% adults with diabetes    Mean Plasma Glucose 10/06/2021 93.93  mg/dL Final   Performed at [facility], [address], [place], [place] [address]   Cholesterol 10/06/2021 162  0 - 200 mg/dL Final   Triglycerides 44/96/7591 115  <150 mg/dL Final   HDL 63/84/6659 50  >40 mg/dL Final   Total CHOL/HDL Ratio 10/06/2021 3.2  RATIO Final   VLDL 10/06/2021 23  0 - 40 mg/dL Final   LDL Cholesterol 10/06/2021 89  0 - 99 mg/dL Final   Comment:        Total Cholesterol/HDL:CHD Risk Coronary Heart Disease Risk Table                     Men   Women  1/2 Average Risk   3.4   3.3  Average Risk       5.0    4.4  2 X Average Risk   9.6   7.1  3 X Average Risk  23.4   11.0        Use the calculated Patient Ratio above and the CHD Risk Table to determine the patient's CHD Risk.        ATP III CLASSIFICATION (LDL):  <100     mg/dL   Optimal  935-701  mg/dL   Near or Above  Optimal  130-159  mg/dL   Borderline  161-096  mg/dL   High  >045     mg/dL   Very High Performed at [facility], [address], [place], [place] [address]    TSH 10/06/2021 0.891  0.350 - 4.500 uIU/mL Final   Comment: Performed by a 3rd Generation assay with a functional sensitivity of <=0.01 uIU/mL. Performed at [facility], [address] [place], [place] [address]    Iron 10/07/2021 131  45 - 182 ug/dL Final   TIBC 82/95/6213 327  250 - 450 ug/dL Final   Saturation Ratios 10/07/2021 40 (H)  17.9 - 39.5 % Final   UIBC 10/07/2021 196  ug/dL Final   Performed at [facility], [address] [place], [place] [address]   Ferritin 10/07/2021 88  24 - 336 ng/mL Final   Performed at [facility], [address] [place], [place] [address]   Vit D, 25-Hydroxy 10/07/2021 23.36 (L)  30 - 100 ng/mL Final   Comment: (NOTE) Vitamin D deficiency has been defined by the Institute of Medicine  and an Endocrine Society practice guideline as a level of serum 25-OH  vitamin D less than 20 ng/mL (1,2). The Endocrine Society went on to  further define vitamin D insufficiency as a level between 21 and 29  ng/mL (2).  1. IOM (Institute of Medicine). 2010. Dietary reference intakes for  calcium and D. [place]: The [employer]. 2. Holick MF, Binkley Paulding, Bischoff-Ferrari HA, et al. Evaluation,  treatment, and prevention of vitamin D deficiency: an Endocrine  Society clinical practice guideline, JCEM. 2011 Jul; 96(7): 1911-30.  Performed at [facility], [address], [place], [place] [address]    Sodium  10/08/2021 146 (H)  135 - 145 mmol/L Final   DELTA CHECK NOTED   Potassium 10/08/2021 4.1  3.5 - 5.1 mmol/L Final   DELTA CHECK NOTED   Chloride 10/08/2021 110  98 - 111 mmol/L Final   CO2 10/08/2021 29  22 - 32 mmol/L Final   Glucose, Bld 10/08/2021 82  70 - 99 mg/dL Final   Glucose reference range applies only to samples taken after fasting for at least 8 hours.   BUN 10/08/2021 15  6 - 20 mg/dL Final   Creatinine, Ser 10/08/2021 0.99  0.61 - 1.24 mg/dL Final   Calcium 41/32/4401 10.0  8.9 - 10.3 mg/dL Final   GFR, Estimated 10/08/2021 >60  >60 mL/min Final   Comment: (NOTE) Calculated using the CKD-EPI Creatinine Equation (2021)    Anion gap 10/08/2021 7  5 - 15 Final   Performed at [facility], [address] [place], [place] [address]   Color, Urine 10/09/2021 STRAW (A)  YELLOW Final   APPearance 10/09/2021 CLEAR  CLEAR Final   Specific Gravity, Urine 10/09/2021 1.014  1.005 - 1.030 Final   pH 10/09/2021 5.0  5.0 - 8.0 Final   Glucose, UA 10/09/2021 NEGATIVE  NEGATIVE mg/dL Final   Hgb urine dipstick 10/09/2021 NEGATIVE  NEGATIVE Final   Bilirubin Urine 10/09/2021 NEGATIVE  NEGATIVE Final   Ketones, ur 10/09/2021 NEGATIVE  NEGATIVE mg/dL Final   Protein, ur 36/64/4034 NEGATIVE  NEGATIVE mg/dL Final   Nitrite 74/25/9563 NEGATIVE  NEGATIVE Final   Leukocytes,Ua 10/09/2021 NEGATIVE  NEGATIVE Final   Performed at [facility] [address], [place], [place] [address]   Sodium 10/10/2021 137  135 - 145 mmol/L  Final   Potassium 10/10/2021 3.7  3.5 - 5.1 mmol/L Final   Chloride 10/10/2021 102  98 - 111 mmol/L Final   CO2 10/10/2021 26  22 - 32 mmol/L Final   Glucose, Bld 10/10/2021 81  70 - 99 mg/dL Final   Glucose reference range applies only to samples taken after fasting for at least 8 hours.   BUN 10/10/2021 15  6 - 20 mg/dL Final   Creatinine, Ser 10/10/2021 1.14  0.61 - 1.24 mg/dL Final   Calcium 16/10/960410/15/2022 9.6  8.9 - 10.3 mg/dL Final    GFR, Estimated 10/10/2021 >60  >60 mL/min Final   Comment: (NOTE) Calculated using the CKD-EPI Creatinine Equation (2021)    Anion gap 10/10/2021 9  5 - 15 Final   Performed at [facility], [address] [place], [place] [id number]    Allergies: Patient has no known allergies.  PTA Medications: (Not in a hospital admission)   Medical Decision Making  [facility] with increased depression, SI, and with alcohol intoxication.  He cannot contract for safety at this time.  He will be admitted to the continuous assessment unit for overnight observation.  He will be reevaluated by psychiatry in the a.m.    Recommendations  Based on my evaluation the patient does not appear to have an emergency medical condition.  Admit patient to the continuous assessment unit for overnight observation.  Patient will be reevaluated by psychiatry in the a.m.  CIWA protocol initiated with Ativan as needed 1 mg every 6 hours for CIWA score greater than 10  Lab Orders         Resp Panel by RT-PCR (Flu A&B, Covid) Anterior Nasal Swab         CBC with Differential/Platelet         Comprehensive metabolic panel         Hemoglobin A1c         Magnesium         Ethanol         Lipid panel         TSH         POCT Urine Drug Screen - (I-Screen)         POC SARS Coronavirus 2 Ag-ED - Nasal Swab      EKG   Ardis Hughsarolyn H Infantof Villagomez, NP 10/04/22  5:51 PM

## 2022-10-04 NOTE — ED Notes (Signed)
Pt sitting on bed quietly. Will monitor for safety.

## 2022-10-05 ENCOUNTER — Other Ambulatory Visit: Payer: Self-pay

## 2022-10-05 ENCOUNTER — Encounter (HOSPITAL_COMMUNITY): Payer: Self-pay | Admitting: Psychiatry

## 2022-10-05 ENCOUNTER — Inpatient Hospital Stay (HOSPITAL_COMMUNITY)
Admission: AD | Admit: 2022-10-05 | Discharge: 2022-10-10 | DRG: 885 | Disposition: A | Discharge status: Home / Self Care | Payer: Medicaid Other | Source: Other Acute Inpatient Hospital | Attending: Psychiatry | Admitting: Psychiatry

## 2022-10-05 DIAGNOSIS — F332 Major depressive disorder, recurrent severe without psychotic features: Principal | ICD-10-CM | POA: Diagnosis present

## 2022-10-05 DIAGNOSIS — F603 Borderline personality disorder: Secondary | ICD-10-CM | POA: Diagnosis present

## 2022-10-05 DIAGNOSIS — Z638 Other specified problems related to primary support group: Secondary | ICD-10-CM | POA: Diagnosis not present

## 2022-10-05 DIAGNOSIS — E7800 Pure hypercholesterolemia, unspecified: Secondary | ICD-10-CM | POA: Diagnosis present

## 2022-10-05 DIAGNOSIS — Z79899 Other long term (current) drug therapy: Secondary | ICD-10-CM

## 2022-10-05 DIAGNOSIS — F410 Panic disorder [episodic paroxysmal anxiety] without agoraphobia: Secondary | ICD-10-CM | POA: Diagnosis present

## 2022-10-05 DIAGNOSIS — F17210 Nicotine dependence, cigarettes, uncomplicated: Secondary | ICD-10-CM | POA: Diagnosis present

## 2022-10-05 DIAGNOSIS — F1010 Alcohol abuse, uncomplicated: Secondary | ICD-10-CM | POA: Diagnosis present

## 2022-10-05 DIAGNOSIS — F411 Generalized anxiety disorder: Secondary | ICD-10-CM | POA: Diagnosis present

## 2022-10-05 DIAGNOSIS — Z1152 Encounter for screening for COVID-19: Secondary | ICD-10-CM | POA: Diagnosis not present

## 2022-10-05 DIAGNOSIS — F1090 Alcohol use, unspecified, uncomplicated: Secondary | ICD-10-CM

## 2022-10-05 DIAGNOSIS — J45909 Unspecified asthma, uncomplicated: Secondary | ICD-10-CM | POA: Diagnosis present

## 2022-10-05 DIAGNOSIS — R45851 Suicidal ideations: Secondary | ICD-10-CM | POA: Diagnosis present

## 2022-10-05 DIAGNOSIS — Z818 Family history of other mental and behavioral disorders: Secondary | ICD-10-CM | POA: Diagnosis not present

## 2022-10-05 DIAGNOSIS — F909 Attention-deficit hyperactivity disorder, unspecified type: Secondary | ICD-10-CM | POA: Diagnosis present

## 2022-10-05 DIAGNOSIS — K589 Irritable bowel syndrome without diarrhea: Secondary | ICD-10-CM | POA: Diagnosis present

## 2022-10-05 DIAGNOSIS — Z9151 Personal history of suicidal behavior: Secondary | ICD-10-CM | POA: Diagnosis not present

## 2022-10-05 DIAGNOSIS — G4700 Insomnia, unspecified: Secondary | ICD-10-CM | POA: Diagnosis present

## 2022-10-05 DIAGNOSIS — F4310 Post-traumatic stress disorder, unspecified: Secondary | ICD-10-CM | POA: Diagnosis present

## 2022-10-05 DIAGNOSIS — Z9152 Personal history of nonsuicidal self-harm: Secondary | ICD-10-CM | POA: Diagnosis not present

## 2022-10-05 MED ORDER — HYDROXYZINE HCL 25 MG PO TABS
25.0000 mg | ORAL_TABLET | Freq: Four times a day (QID) | ORAL | Status: AC | PRN
  Administered 2022-10-05 – 2022-10-07 (×3): 25 mg via ORAL
  Filled 2022-10-05 (×4): qty 1

## 2022-10-05 MED ORDER — ADULT MULTIVITAMIN W/MINERALS CH
1.0000 | ORAL_TABLET | Freq: Every day | ORAL | Status: DC
  Administered 2022-10-06 – 2022-10-10 (×5): 1 via ORAL
  Filled 2022-10-05 (×7): qty 1

## 2022-10-05 MED ORDER — ACETAMINOPHEN 325 MG PO TABS: 650.0000 mg | ORAL_TABLET | Freq: Four times a day (QID) | ORAL | Status: DC | PRN

## 2022-10-05 MED ORDER — LORAZEPAM 1 MG PO TABS
1.0000 mg | ORAL_TABLET | Freq: Four times a day (QID) | ORAL | Status: AC | PRN
  Administered 2022-10-06: 1 mg via ORAL
  Filled 2022-10-05: qty 1

## 2022-10-05 MED ORDER — ONDANSETRON 4 MG PO TBDP: 4.0000 mg | ORAL_TABLET | Freq: Four times a day (QID) | ORAL | Status: AC | PRN

## 2022-10-05 MED ORDER — LOPERAMIDE HCL 2 MG PO CAPS: 2.0000 mg | ORAL_CAPSULE | ORAL | Status: AC | PRN

## 2022-10-05 MED ORDER — TRAZODONE HCL 50 MG PO TABS
50.0000 mg | ORAL_TABLET | Freq: Every evening | ORAL | Status: DC | PRN
  Administered 2022-10-05 – 2022-10-09 (×5): 50 mg via ORAL
  Filled 2022-10-05 (×5): qty 1

## 2022-10-05 MED ORDER — ALUM & MAG HYDROXIDE-SIMETH 200-200-20 MG/5ML PO SUSP: 30.0000 mL | ORAL | Status: DC | PRN

## 2022-10-05 MED ORDER — NICOTINE 14 MG/24HR TD PT24
14.0000 mg | MEDICATED_PATCH | Freq: Every day | TRANSDERMAL | Status: DC
  Administered 2022-10-05 – 2022-10-10 (×6): 14 mg via TRANSDERMAL
  Filled 2022-10-05 (×8): qty 1

## 2022-10-05 MED ORDER — MAGNESIUM HYDROXIDE 400 MG/5ML PO SUSP
30.0000 mL | Freq: Every day | ORAL | Status: DC | PRN
  Administered 2022-10-06 – 2022-10-07 (×2): 30 mL via ORAL
  Filled 2022-10-05 (×2): qty 30

## 2022-10-05 MED ORDER — VITAMIN B-1 100 MG PO TABS
100.0000 mg | ORAL_TABLET | Freq: Every day | ORAL | Status: DC
  Administered 2022-10-06 – 2022-10-10 (×5): 100 mg via ORAL
  Filled 2022-10-05 (×7): qty 1

## 2022-10-05 NOTE — ED Notes (Signed)
Safe transport called for pick up. 

## 2022-10-05 NOTE — ED Notes (Signed)
Pt sleeping at present. Will continue to monitor.

## 2022-10-05 NOTE — ED Provider Notes (Signed)
FBC/OBS ASAP Discharge Summary  Date and Time: 10/05/2022 9:58 AM  Name: [name]  MRN:  [id number]   Discharge Diagnoses:  Final diagnoses:  MDD (major depressive disorder), recurrent severe, without psychosis (HCC)    Subjective: [name] 26 y.o., male patient presented to [facility] as a walk-in voluntarily with complaints of increased depression, SI, and alcohol intoxication.   [facility] [place], 26 y.o., male patient seen face to face by this provider, consulted with Dr. Lucianne Muss; and chart reviewed on 10/05/22.  Per chart review patient has a past psychiatric history of MDD, ADHD, ODD, borderline personality disorder, anxiety substance abuse, PTSD.  He currently has no outpatient psychiatric services in place and does not take any medications.  He reports past psychiatric hospitalizations with his last hospitalization on 09/2021 at [facility] behavioral health.  He reports roughly 5 suicide attempts in the past  On today's reevaluation [name] is observed laying in his bed awake.  He is in no acute distress.  He is alert/oriented x4, cooperative, and attentive.  He has normal speech.  Reports that he is feeling better today after having some sleep.  Upon admission EtOH was 242.  UDS is positive for marijuana.  He continues to endorse depression and anxiety.  States things have not been good for a long time.  States before he came in for assessment he had just gotten into a huge argument with his family and that triggered his SI.  He is not currently endorsing suicidal ideations but states they come and go and when they come they hit hard.  He cannot contract for safety at this time.  He denies HI/AVH.  Objectively he does not appear to be responding to internal/external stimuli.  He denies paranoid and delusional thought content.  Discussed inpatient psychiatric admission with patient. He expresses his concerns about being on the observation unit and states "this  is not what I thought it would be".  Extensively explained the differences between a continuous assessment unit versus an inpatient psychiatric admission.  Patient agreed to inpatient psychiatric admission.  Discussed starting medications for depression and patient states he will think about it.  Stay Summary: Patient remained calm and cooperative while on the unit.  He exhibited no unsafe behaviors while on the unit.  He was appropriate with staff and other patients.  He continues to meet criteria for inpatient psychiatric admission.  Contacted [facility]  and patient has been accepted.  Total Time spent with patient: 30 minutes  Past Psychiatric History: See H&P Past Medical History:  Past Medical History:  Diagnosis Date   ADHD (attention deficit hyperactivity disorder)    Anxiety    Asthma    Constipation    Deliberate self-cutting    Depression    IBS (irritable bowel syndrome)    Irritable bowel syndrome    Post traumatic stress disorder (PTSD)    Suicide attempt (HCC)    Vision abnormalities    Pt states he wears glasses   No past surgical history on file. Family History:  Family History  Problem Relation Age of Onset   ADD / ADHD Brother    Hirschsprung's disease Neg Hx    Family Psychiatric History: See H&P Social History:  Social History   Substance and Sexual Activity  Alcohol Use Not Currently   Comment: Pt states he drinks on occassion     Social History   Substance and Sexual Activity  Drug Use No   Types:  Oxycodone   Comment: last used in 7th grade when he "tried it"/used oxycodone last 2015    Social History   Socioeconomic History   Marital status: Single    Spouse name: Not on file   Number of children: Not on file   Years of education: Not on file   Highest education level: Not on file  Occupational History   Not on file  Tobacco Use   Smoking status: Every Day    Packs/day: 1.50    Types: Cigarettes, Cigars   Smokeless tobacco: Never  Vaping  Use   Vaping Use: Never used  Substance and Sexual Activity   Alcohol use: Not Currently    Comment: Pt states he drinks on occassion   Drug use: No    Types: Oxycodone    Comment: last used in 7th grade when he "tried it"/used oxycodone last 2015   Sexual activity: Never  Other Topics Concern   Not on file  Social History Narrative   ** Merged History Encounter **       9th grade         Social Determinants of Health   Financial Resource Strain: Not on file  Food Insecurity: Not on file  Transportation Needs: Not on file  Physical Activity: Not on file  Stress: Not on file  Social Connections: Not on file   SDOH:  SDOH Screenings   Alcohol Screen: Low Risk  (10/05/2021)  Tobacco Use: High Risk (10/05/2021)    Tobacco Cessation:  N/A, patient does not currently use tobacco products  Current Medications:  Current Facility-Administered Medications  Medication Dose Route Frequency Provider Last Rate Last Admin   acetaminophen (TYLENOL) tablet 650 mg  650 mg Oral Q6H PRN Ardis Hughs, NP       alum & mag hydroxide-simeth (MAALOX/MYLANTA) 200-200-20 MG/5ML suspension 30 mL  30 mL Oral Q4H PRN Ardis Hughs, NP       hydrOXYzine (ATARAX) tablet 25 mg  25 mg Oral Q6H PRN Ardis Hughs, NP   25 mg at 10/05/22 0026   loperamide (IMODIUM) capsule 2-4 mg  2-4 mg Oral PRN Ardis Hughs, NP       LORazepam (ATIVAN) tablet 1 mg  1 mg Oral Q6H PRN Ardis Hughs, NP   1 mg at 10/05/22 0428   magnesium hydroxide (MILK OF MAGNESIA) suspension 30 mL  30 mL Oral Daily PRN Ardis Hughs, NP       multivitamin with minerals tablet 1 tablet  1 tablet Oral Daily Ardis Hughs, NP   1 tablet at 10/05/22 0924   ondansetron (ZOFRAN-ODT) disintegrating tablet 4 mg  4 mg Oral Q6H PRN Ardis Hughs, NP       thiamine (VITAMIN B1) injection 100 mg  100 mg Intramuscular Once Ardis Hughs, NP       thiamine (VITAMIN B1) tablet 100 mg  100 mg Oral Daily  Vernard Gambles H, NP   100 mg at 10/05/22 7356   traZODone (DESYREL) tablet 50 mg  50 mg Oral QHS PRN Ardis Hughs, NP   50 mg at 10/05/22 0026   Current Outpatient Medications  Medication Sig Dispense Refill   acetaminophen (TYLENOL) 325 MG tablet Take 325 mg by mouth every 6 (six) hours as needed for headache.      PTA Medications: (Not in a hospital admission)       No data to display  Flowsheet Row ED from 10/04/20[facility] in [facility]Dis[facility] from OP [facility]  C-SSRS RISK CATEGORY High Risk High Risk       Musculoskeletal  Strength & Muscle Tone: within normal limits Gait & Station: normal Patient leans: N/A  Psychiatric Specialty Exam  Presentation  General Appearance:  Casual  Eye Contact: Fair  Speech: Clear and Coherent; Normal Rate  Speech Volume: Normal  Handedness: Right   Mood and Affect  Mood: Depressed; Anxious  Affect: Congruent   Thought Process  Thought Processes: Coherent  Descriptions of Associations:Intact  Orientation:Full (Time, Place and Person)  Thought Content:Logical  Diagnosis of Schizophrenia or Schizoaffective disorder in past: No    Hallucinations:Hallucinations: None  Ideas of Reference:None  Suicidal Thoughts:Suicidal Thoughts: No SI Passive Intent and/or Plan: Without Intent; With Plan; With Access to Means  Homicidal Thoughts:Homicidal Thoughts: No   Sensorium  Memory: Immediate Good; Recent Good; Remote Good  Judgment: Fair  Insight: Fair   Art therapist  Concentration: Good  Attention Span: Good  Recall: Good  Fund of Knowledge: Good  Language: Good   Psychomotor Activity  Psychomotor Activity: Psychomotor Activity: Normal   Assets  Assets: Communication Skills; Desire for Improvement; Housing; Leisure Time; Physical Health; Resilience; Social  Support   Sleep  Sleep: Sleep: Fair   Nutritional Assessment (For OBS and FBC admissions only) Has the patient had a weight loss or gain of 10 pounds or more in the last 3 months?: No Has the patient had a decrease in food intake/or appetite?: Yes Does the patient have dental problems?: No Does the patient have eating habits or behaviors that may be indicators of an eating disorder including binging or inducing vomiting?: Yes Has the patient recently lost weight without trying?: 2.0 Has the patient been eating poorly because of a decreased appetite?: 1 Malnutrition Screening Tool Score: 3    Physical Exam  Physical Exam Vitals and nursing note reviewed.  Constitutional:      General: He is not in acute distress.    Appearance: Normal appearance. He is well-developed.  HENT:     Head: Normocephalic.  Eyes:     General:        Right eye: No discharge.        Left eye: No discharge.     Conjunctiva/sclera: Conjunctivae normal.  Cardiovascular:     Rate and Rhythm: Normal rate.  Pulmonary:     Effort: Pulmonary effort is normal. No respiratory distress.  Musculoskeletal:        General: Normal range of motion.     Cervical back: Normal range of motion.  Skin:    Coloration: Skin is not jaundiced or pale.  Neurological:     Mental Status: He is alert and oriented to person, place, and time.  Psychiatric:        Attention and Perception: Attention and perception normal.        Mood and Affect: Mood is anxious and depressed.        Speech: Speech normal.        Behavior: Behavior normal. Behavior is cooperative.        Thought Content: Thought content does not include suicidal ideation. Thought content does not include suicidal plan.        Cognition and Memory: Cognition normal.        Judgment: Judgment is impulsive.    Review of Systems  Constitutional: Negative.  HENT: Negative.    Eyes: Negative.   Respiratory: Negative.    Cardiovascular: Negative.    Musculoskeletal: Negative.   Skin: Negative.   Neurological: Negative.   Psychiatric/Behavioral:  Positive for depression. The patient is nervous/anxious.    Blood pressure 124/87, pulse 63, temperature 98.4 F (36.9 C), temperature source Oral, resp. rate 20, height 5\' 5"  (1.651 m), weight 185 lb (83.9 kg), SpO2 98 %. Body mass index is 30.79 kg/m.  Disposition:   Discharge patient and readmit to [facility] for inpatient psychiatric admission.  Revonda Humphrey, NP 10/05/2022, 9:58 AM
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
## 2022-10-05 NOTE — Discharge Instructions (Addendum)
Transfer to [facility] for IP psychiatric admissoin

## 2022-10-05 NOTE — ED Notes (Signed)
Voluntary consent form faxed to [facility] Confirmation received. Pt currently watching tv in observation unit. Safety maintained and will continue to monitor.

## 2022-10-05 NOTE — ED Notes (Signed)
Report called to [facility] 

## 2022-10-05 NOTE — ED Notes (Signed)
Pt sleeping at present, no distress noted.  Monitoring for safety. 

## 2022-10-05 NOTE — ED Notes (Signed)
Discharge instructions provided and Pt stated understanding. Pt alert, orient and ambulatory prior to d/c from facility. Personal belongings returned from locker number 71. Report previously called to [facility] per other staff nurse. Em[place]ala and voluntary consent form placed in envelope for transfer, given to driver. Safe transport called for transportation services. Pt escorted to the sally port. Safety maintained.

## 2022-10-05 NOTE — Progress Notes (Addendum)
Pt was accepted to [facility] 10/05/22; Bed Assignment 304-1  DX:MDD  Voluntary Consent form was signed and faxed to [phone]  Pt meets inpatient criteria per Thomes Lolling, NP  Attending Physician will be Dr. Janine Limbo  Report can be called to: Adult unit: [phone]  Pt can arrive anytime transportation can be arranged.  Care Team notified: Raymond, RN, Lonia Blood, RN, Leota Jacobsen, LPN, Thomes Lolling, NP, [place], LCSWA 10/05/2022 @ 11:23 AM

## 2022-10-05 NOTE — ED Notes (Signed)
Pt accepted scheduled meds w/o difficulty. Denied SI/HI/AVH. Currently resting on pull out bed/chair in no distress. Breakfast provided. Safety maintained and will continue to monitor.

## 2022-10-05 NOTE — Progress Notes (Signed)
Pt admitted to [facility] at 1pm. Pt reports seeking help with depression and suicidal thoughts. Pt states he has been struggling with declining mental health. Pt states in an effort to cope he has been drinking wine daily. Pt reports feeling suicidal with plan to cut self or overdose. Pt stated he began binge drinking vodka yesterday to build up the courage to act on suicidal thoughts. Pt reports receiving a call from a friend, who encouraged pt to seek help and took him to [facility]. Pt reports living in an apartment alone. Pt states he impulsively quit his job recently, does note that the environment is "toxic." Pt states he has started hearing voices, reports this is new to him. Pt denies SI at present noting "the thoughts come and go". Pt reports a history of binge/purge behaviors, reports not engaging recently in purging but endorses binge eating yesterday. Pt has a history of self harm by cutting, scars noted to left forearm. Pt given tour and shown to youu.
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
## 2022-10-06 ENCOUNTER — Encounter (HOSPITAL_COMMUNITY): Payer: Self-pay

## 2022-10-06 DIAGNOSIS — F1090 Alcohol use, unspecified, uncomplicated: Secondary | ICD-10-CM

## 2022-10-06 MED ORDER — LORAZEPAM 1 MG PO TABS
1.0000 mg | ORAL_TABLET | Freq: Every day | ORAL | Status: AC
  Administered 2022-10-10: 1 mg via ORAL
  Filled 2022-10-06: qty 1

## 2022-10-06 MED ORDER — GABAPENTIN 100 MG PO CAPS
100.0000 mg | ORAL_CAPSULE | Freq: Three times a day (TID) | ORAL | Status: DC
  Administered 2022-10-06 – 2022-10-10 (×12): 100 mg via ORAL
  Filled 2022-10-06 (×2): qty 1
  Filled 2022-10-06: qty 21
  Filled 2022-10-06 (×10): qty 1
  Filled 2022-10-06 (×2): qty 21
  Filled 2022-10-06 (×3): qty 1

## 2022-10-06 MED ORDER — LORAZEPAM 1 MG PO TABS
1.0000 mg | ORAL_TABLET | Freq: Two times a day (BID) | ORAL | Status: AC
  Administered 2022-10-09 (×2): 1 mg via ORAL
  Filled 2022-10-06 (×2): qty 1

## 2022-10-06 MED ORDER — VENLAFAXINE HCL ER 75 MG PO CP24
75.0000 mg | ORAL_CAPSULE | Freq: Every day | ORAL | Status: DC
  Administered 2022-10-07 – 2022-10-10 (×4): 75 mg via ORAL
  Filled 2022-10-06 (×5): qty 1
  Filled 2022-10-06: qty 7

## 2022-10-06 MED ORDER — VENLAFAXINE HCL ER 37.5 MG PO CP24
37.5000 mg | ORAL_CAPSULE | Freq: Every day | ORAL | Status: AC
  Administered 2022-10-06: 37.5 mg via ORAL
  Filled 2022-10-06 (×2): qty 1

## 2022-10-06 MED ORDER — LORAZEPAM 1 MG PO TABS
1.0000 mg | ORAL_TABLET | Freq: Three times a day (TID) | ORAL | Status: AC
  Administered 2022-10-08 (×3): 1 mg via ORAL
  Filled 2022-10-06 (×3): qty 1

## 2022-10-06 MED ORDER — LORAZEPAM 1 MG PO TABS
1.0000 mg | ORAL_TABLET | Freq: Four times a day (QID) | ORAL | Status: AC
  Administered 2022-10-06 – 2022-10-07 (×6): 1 mg via ORAL
  Filled 2022-10-06 (×6): qty 1

## 2022-10-06 NOTE — Group Note (Unsigned)
[facility] LCSW Group Therapy Note   Group Date: 10/06/2022 Start Time: 1300 End Time: 1400   Type of Therapy/Topic:  Group Therapy:  Balance in Life  Participation Level:  {[facility] PARTICIPATION LEVEL:22264}   Description of Group:    This group will address the concept of balance and how it feels and looks when one is unbalanced. Patients will be encouraged to process areas in their lives that are out of balance, and identify reasons for remaining unbalanced. Facilitators will guide patients utilizing problem- solving interventions to address and correct the stressor making their life unbalanced. Understanding and applying boundaries will be explored and addressed for obtaining  and maintaining a balanced life. Patients will be encouraged to explore ways to assertively make their unbalanced needs known to significant others in their lives, using other group members and facilitator for support and feedback.  Therapeutic Goals: 1. Patient will identify two or more emotions or situations they have that consume much of in their lives. 2. Patient will identify signs/triggers that life has become out of balance:  3. Patient will identify two ways to set boundaries in order to achieve balance in their lives:  4. Patient will demonstrate ability to communicate their needs through discussion and/or role plays  Summary of Patient Progress:    ***    Therapeutic Modalities:   Cognitive Behavioral Therapy Solution-Focused Therapy Assertiveness Training   Elijah Hauschild [place], LCSW 
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
## 2022-10-06 NOTE — BHH Suicide Risk Assessment (Signed)
[facility] INPATIENT:  Family/Significant Other Suicide Prevention Education  Suicide Prevention Education:  Education Completed; 10-06-2022 Kendall Flack  Case Manager) [phone] has been identified by the patient as the family member/significant other with whom the patient will be residing, and identified as the person(s) who will aid the patient in the event of a mental health crisis (suicidal ideations/suicide attempt).  With written consent from the patient, the family member/significant other has been provided the following suicide prevention education, prior to the and/or following the discharge of the patient.  The suicide prevention education provided includes the following: Suicide risk factors Suicide prevention and interventions National Suicide Hotline telephone number [facility] assessment telephone number [facility] Emergency Assistance [place] and/or Residential Mobile Crisis Unit telephone number  Request made of family/significant other to: Remove weapons (e.g., guns, rifles, knives), all items previously/currently identified as safety concern.   Remove drugs/medications (over-the-counter, prescriptions, illicit drugs), all items previously/currently identified as a safety concern.  The family member/significant other verbalizes understanding of the suicide prevention education information provided.  The Case Manager agrees to remove the items of safety concern listed above.  [place] MSW, LCSW 10/06/2022, 11:11 AM
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
## 2022-10-06 NOTE — Progress Notes (Signed)
   10/06/22 2045  Psych Admission Type (Psych Patients Only)  Admission Status Voluntary  Psychosocial Assessment  Patient Complaints Depression  Eye Contact Fair  Facial Expression Flat  Affect Appropriate to circumstance  Speech Logical/coherent  Interaction Assertive  Motor Activity Slow  Appearance/Hygiene Unremarkable  Behavior Characteristics Cooperative  Mood Depressed  Aggressive Behavior  Effect No apparent injury  Thought Process  Coherency WDL  Content WDL  Delusions WDL  Perception WDL  Hallucination None reported or observed  Judgment WDL  Confusion WDL  Danger to Self  Current suicidal ideation? Denies  Danger to Others  Danger to Others None reported or observed

## 2022-10-06 NOTE — Progress Notes (Signed)
The patient attended the evening N.A.group and was appropriate.  

## 2022-10-06 NOTE — BH IP Treatment Plan (Signed)
Interdisciplinary Treatment and Diagnostic Plan Update  10/06/2022 Time of Session: [address] [place] MRN: [id number]  Principal Diagnosis: MDD (major depressive disorder), recurrent severe, without psychosis (HCC)  Secondary Diagnoses: Principal Problem:   MDD (major depressive disorder), recurrent severe, without psychosis (HCC)   Current Medications:  Current Facility-Administered Medications  Medication Dose Route Frequency Provider Last Rate Last Admin   acetaminophen (TYLENOL) tablet 650 mg  650 mg Oral Q6H PRN Ardis Hughs, NP       alum & mag hydroxide-simeth (MAALOX/MYLANTA) 200-200-20 MG/5ML suspension 30 mL  30 mL Oral Q4H PRN Ardis Hughs, NP       hydrOXYzine (ATARAX) tablet 25 mg  25 mg Oral Q6H PRN Ardis Hughs, NP   25 mg at 10/05/22 2135   loperamide (IMODIUM) capsule 2-4 mg  2-4 mg Oral PRN Ardis Hughs, NP       LORazepam (ATIVAN) tablet 1 mg  1 mg Oral Q6H PRN Ardis Hughs, NP       magnesium hydroxide (MILK OF MAGNESIA) suspension 30 mL  30 mL Oral Daily PRN Ardis Hughs, NP       multivitamin with minerals tablet 1 tablet  1 tablet Oral Daily Ardis Hughs, NP   1 tablet at 10/06/22 0806   nicotine (NICODERM CQ - dosed in mg/24 hours) patch 14 mg  14 mg Transdermal Daily Masseng[place], Harrold Donath, MD   14 mg at 10/06/22 0806   ondansetron (ZOFRAN-ODT) disintegrating tablet 4 mg  4 mg Oral Q6H PRN Ardis Hughs, NP       thiamine (Vitamin B-1) tablet 100 mg  100 mg Oral Daily Ardis Hughs, NP   100 mg at 10/06/22 0806   traZODone (DESYREL) tablet 50 mg  50 mg Oral QHS PRN Ardis Hughs, NP   50 mg at 10/05/22 2135   PTA Medications: Medications Prior to Admission  Medication Sig Dispense Refill Last Dose   acetaminophen (TYLENOL) 325 MG tablet Take 325 mg by mouth every 6 (six) hours as needed for headache.       Patient Stressors:    Patient Strengths:    Treatment Modalities: Medication Management,  Group therapy, Case management,  1 to 1 session with clinician, Psychoeducation, Recreational therapy.   Physician Treatment Plan for Primary Diagnosis: MDD (major depressive disorder), recurrent severe, without psychosis (HCC) Long Term Goal(s):     Short Term Goals:    Medication Management: Evaluate patient's response, side effects, and tolerance of medication regimen.  Therapeutic Interventions: 1 to 1 sessions, Unit Group sessions and Medication administration.  Evaluation of Outcomes: Progressing  Physician Treatment Plan for Secondary Diagnosis: Principal Problem:   MDD (major depressive disorder), recurrent severe, without psychosis (HCC)  Long Term Goal(s):     Short Term Goals:       Medication Management: Evaluate patient's response, side effects, and tolerance of medication regimen.  Therapeutic Interventions: 1 to 1 sessions, Unit Group sessions and Medication administration.  Evaluation of Outcomes: Progressing   RN Treatment Plan for Primary Diagnosis: MDD (major depressive disorder), recurrent severe, without psychosis (HCC) Long Term Goal(s): Knowledge of disease and therapeutic regimen to maintain health will improve  Short Term Goals: Ability to remain free from injury will improve, Ability to verbalize frustration and anger appropriately will improve, Ability to demonstrate self-control, Ability to participate in decision making will improve, Ability to verbalize feelings will improve, Ability to disclose and discuss suicidal ideas, Ability to identify and develop  effective coping behaviors will improve, and Compliance with prescribed medications will improve  Medication Management: RN will administer medications as ordered by provider, will assess and evaluate patient's response and provide education to patient for prescribed medication. RN will report any adverse and/or side effects to prescribing provider.  Therapeutic Interventions: 1 on 1 counseling  sessions, Psychoeducation, Medication administration, Evaluate responses to treatment, Monitor vital signs and CBGs as ordered, Perform/monitor CIWA, COWS, AIMS and Fall Risk screenings as ordered, Perform wound care treatments as ordered.  Evaluation of Outcomes: Progressing   LCSW Treatment Plan for Primary Diagnosis: MDD (major depressive disorder), recurrent severe, without psychosis ([place]) Long Term Goal(s): Safe transition to appropriate next level of care at discharge, Engage patient in therapeutic group addressing interpersonal concerns.  Short Term Goals: Engage patient in aftercare planning with referrals and resources, Increase social support, Increase ability to appropriately verbalize feelings, Increase emotional regulation, Facilitate acceptance of mental health diagnosis and concerns, Facilitate patient progression through stages of change regarding substance use diagnoses and concerns, Identify triggers associated with mental health/substance abuse issues, and Increase skills for wellness and recovery  Therapeutic Interventions: Assess for all discharge needs, 1 to 1 time with Social worker, Explore available resources and support systems, Assess for adequacy in community support network, Educate family and significant other(s) on suicide prevention, Complete Psychosocial Assessment, Interpersonal group therapy.  Evaluation of Outcomes: Progressing   Progress in Treatment: Attending groups: Yes. Participating in groups: Yes. Taking medication as prescribed: Yes. Toleration medication: Yes. Family/Significant other contact made: No, will contact:  CSW will reach out to family/friend once patient has provided consent.  Patient understands diagnosis: Yes. Discussing patient identified problems/goals with staff: Yes. Medical problems stabilized or resolved: Yes. Denies suicidal/homicidal ideation: No. Issues/concerns per patient self-inventory: Yes. Other: none  New problem(s)  identified: No, Describe:  none  New Short Term/Long Term Goal(s): Patient to work towards detox, medication management for mood stabilization; elimination of SI thoughts; development of comprehensive mental wellness/sobriety plan.  Patient Goals: Patient states their goal for treatment is to "alleviate depression and coping mechanism."  Discharge Plan or Barriers: No psychosocial barriers identified at this time, patient to return to place of residence when appropriate for discharge.    Reason for Continuation of Hospitalization: Depression  Estimated Length of Stay: 1-7 days   Last 3 [place] Suicide Severity Risk Score: Flowsheet Row Admission (Current) from 10/05/2022 in [facility] ED from 10/04/2022 in [facility] Admission (Discharged) from OP Visit from 10/05/2021 in [facility]  C-SSRS RISK CATEGORY Low Risk High Risk High Risk       Scribe for Treatment Team: Larose Kells 10/06/2022 10:49 AM

## 2022-10-06 NOTE — [facility] Group Notes (Signed)
PsychoEducational Group. Patients were given education on the power of positive re-framing. Patients were given interactive exercise in which they were asked to use positive reframing in ordinary situations.  Patients were then read poem of the power of thoughts by the Dali Lama and asked to reflect. Pt did not attend. 

## 2022-10-06 NOTE — Progress Notes (Signed)
   10/06/22 0530  Sleep  Number of Hours 7    

## 2022-10-06 NOTE — Group Note (Unsigned)
LCSW Group Therapy Note   Group Date: 10/06/2022 Start Time: 1300 End Time: 1400   Type of Therapy and Topic:  Group Therapy:   Participation Level:  {[facility] PARTICIPATION LEVEL:22264}  Description of Group:   Therapeutic Goals:  1.     Summary of Patient Progress:    ***  Therapeutic Modalities:   [name] S Marion Rosenberry, LCSWA 10/06/2022  3:43 PM    

## 2022-10-06 NOTE — [facility] Group Notes (Signed)
Adult Psychoeducational Group Note  Date:  10/06/2022 Time:  10:16 AM  Group Topic/Focus:  Goals Group:   The focus of this group is to help patients establish daily goals to achieve during treatment and discuss how the patient can incorporate goal setting into their daily lives to aide in recovery.  Participation Level:  Active  Participation Quality:  Appropriate  Affect:  Appropriate  Cognitive:  Appropriate  Insight: Appropriate  Engagement in Group:  Engaged  Modes of Intervention:  Education  Kern Reap 10/06/2022, 10:16 AM

## 2022-10-06 NOTE — Progress Notes (Signed)
Patient ID: [name], male   DOB: [birth year], 26 y.o.   MRN: [id number] Pt presents with depressed mood, affect congruent. [name] reports he is '' feeling okay. Still depressed ''  When approached this am. He is pleasant, and does state that he had stopped his medications over a year ago after complication with follow up at the [facility] as his job started early and he could never make the early walk in appointments.  Pt shared he just '' quit the job '' and states he is hopeful to now get follow up and his mental health back on track.  Patient denies any withdrawal symptoms. CIWA completed and pt scored 1 this am. He did complete his self inventory and rates his depression at 2/10 on scale, 10 being worst 0 being none. He rates his hopelessness at 3/10 on scale, 10 being worst 0 being none. He rates his anxiety at 5/10 on scale, 10 being worst 0 being none. Pt does report his goal is to work on his depressive symptoms and he plans to speak to staff and participate in groups.  Patient has been visible in the milieu. Attending programming and cooperative with peers.  Pt is safe, will con't to monitor.

## 2022-10-06 NOTE — Group Note (Signed)
LCSW Group Therapy Note  Group Date: 10/06/2022 Start Time: 1300 End Time: 1345   Type of Therapy and Topic:  Group Therapy: Anger Cues and Responses  Participation Level:  Did Not Attend   Description of Group:   In this group, patients learned how to recognize the physical, cognitive, emotional, and behavioral responses they have to anger-provoking situations.  They identified a recent time they became angry and how they reacted.  They analyzed how their reaction was possibly beneficial and how it was possibly unhelpful.  The group discussed a variety of healthier coping skills that could help with such a situation in the future.  Focus was placed on how helpful it is to recognize the underlying emotions to our anger, because working on those can lead to a more permanent solution as well as our ability to focus on the important rather than the urgent.  Therapeutic Goals: Patients will remember their last incident of anger and how they felt emotionally and physically, what their thoughts were at the time, and how they behaved. Patients will identify how their behavior at that time worked for them, as well as how it worked against them. Patients will explore possible new behaviors to use in future anger situations. Patients will learn that anger itself is normal and cannot be eliminated, and that healthier reactions can assist with resolving conflict rather than worsening situations.     Marval Regal Kaydan Wong, LCSW 10/06/2022  3:50 PM

## 2022-10-07 NOTE — Progress Notes (Signed)
   10/07/22 0545  Sleep  Number of Hours 6.5    

## 2022-10-07 NOTE — Progress Notes (Signed)
Pt showered this morning, remains medication compliant. Pt presents withy flat affect, endorses depression but denies suicidal thoughts at this time. Pt complains of epigastric discomfort and constipation, declines prn at this time. Q 15 minute checks ongoing.

## 2022-10-07 NOTE — Progress Notes (Signed)
[facility] MD Progress Note  10/07/2022 5:48 PM [place]  MRN:  [id number]    Subjective:   Patient is a 26 year old male with a psychiatric history of MDD, she AD, PTSD, borderline personality disorder, substance abuse (in addition to reported history of ADHD and documented history of ODD in the chart), who was admitted to the psychiatric unit from the [facility] for evaluation of worsening depression and suicidal thoughts.  Patient reports he was discharged from this hospital about 1 year ago, and and took his medication for the 30 days that he had prescribed, but did not follow up at the [facility] due to limited walk-in hours and working in the morning during these walk-in hours.  He reports he was therefore unable to continue taking his medications.    On assessment today the pt reports that mood is still down depressed and sad. Reports better sleep. Reports appetite is less. Reports concentration is better. Reports anxiety is high. Reports passive SI off and on, no intent or plan. Denies s/e to restarting meds.  Reports less alcohol w/d sx since starting ativan taper.    Principal Problem: MDD (major depressive disorder), recurrent severe, without psychosis ([place]) Diagnosis: Principal Problem:   MDD (major depressive disorder), recurrent severe, without psychosis ([place]) Active Problems:   PTSD (post-traumatic stress disorder)   Alcohol use disorder  Total Time spent with patient: 20 minutes  Past Psychiatric History:  Previously diagnosed with MDD, GAD, PTSD. Reports previous diagnosis of ADHD.  Per chart there is a history of ODD. Patient reports a history of multiple psychiatric hospitalizations in the past Patient reports a history of multiple suicide attempts in the past by overdose. Does not have a current psychiatrist. Current psychiatric medications: None Past psychiatric medication history: Lithium, Adderall, Prozac, Wellbutrin, Lexapro, Zoloft, Effexor, gabapentin, trazodone,  Abilify, Remeron    Past Medical History:  Past Medical History:  Diagnosis Date   ADHD (attention deficit hyperactivity disorder)    Anxiety    Asthma    Constipation    Deliberate self-cutting    Depression    IBS (irritable bowel syndrome)    Irritable bowel syndrome    Post traumatic stress disorder (PTSD)    Suicide attempt ([place])    Vision abnormalities    Pt states he wears glasses   History reviewed. No pertinent surgical history. Family History:  Family History  Problem Relation Age of Onset   ADD / ADHD Brother    Hirschsprung's disease Neg Hx    Family Psychiatric  History:  Patient reports that multiple members on his mother's side of the family struggle from depression.  Reports mother attempted suicide.       Social History:  Social History   Substance and Sexual Activity  Alcohol Use Not Currently   Comment: Pt states he drinks on occassion     Social History   Substance and Sexual Activity  Drug Use No   Types: Oxycodone   Comment: last used in 7th grade when he "tried it"/used oxycodone last 2015    Social History   Socioeconomic History   Marital status: Single    Spouse name: Not on file   Number of children: Not on file   Years of education: Not on file   Highest education level: Not on file  Occupational History   Not on file  Tobacco Use   Smoking status: Every Day    Packs/day: 1.50    Types: Cigarettes, Cigars   Smokeless tobacco: Never  Vaping Use   Vaping Use: Never used  Substance and Sexual Activity   Alcohol use: Not Currently    Comment: Pt states he drinks on occassion   Drug use: No    Types: Oxycodone    Comment: last used in 7th grade when he "tried it"/used oxycodone last 2015   Sexual activity: Never  Other Topics Concern   Not on file  Social History Narrative   ** Merged History Encounter **       9th grade         Social Determinants of Health   Financial Resource Strain: Not on file  Food  Insecurity: No Food Insecurity (10/05/2022)   Hunger Vital Sign    Worried About Running Out of Food in the Last Year: Never true    Ran Out of Food in the Last Year: Never true  Transportation Needs: No Transportation Needs (10/05/2022)   PRAPARE - Administrator, Civil Service (Medical): No    Lack of Transportation (Non-Medical): No  Physical Activity: Not on file  Stress: Not on file  Social Connections: Not on file   Additional Social History:                         Sleep: Fair  Appetite:  Fair  Current Medications: Current Facility-Administered Medications  Medication Dose Route Frequency Provider Last Rate Last Admin   acetaminophen (TYLENOL) tablet 650 mg  650 mg Oral Q6H PRN Ardis Hughs, NP       alum & mag hydroxide-simeth (MAALOX/MYLANTA) 200-200-20 MG/5ML suspension 30 mL  30 mL Oral Q4H PRN Ardis Hughs, NP       gabapentin (NEURONTIN) capsule 100 mg  100 mg Oral TID Phineas Inches, MD   100 mg at 10/07/22 1702   hydrOXYzine (ATARAX) tablet 25 mg  25 mg Oral Q6H PRN Ardis Hughs, NP   25 mg at 10/06/22 1618   loperamide (IMODIUM) capsule 2-4 mg  2-4 mg Oral PRN Ardis Hughs, NP       LORazepam (ATIVAN) tablet 1 mg  1 mg Oral Q6H PRN Ardis Hughs, NP   1 mg at 10/06/22 1509   LORazepam (ATIVAN) tablet 1 mg  1 mg Oral QID Annina Piotrowski, Harrold Donath, MD   1 mg at 10/07/22 1702   Followed by   Melene Muller ON 10/08/2022] LORazepam (ATIVAN) tablet 1 mg  1 mg Oral TID Phineas Inches, MD       Followed by   Melene Muller ON 10/09/2022] LORazepam (ATIVAN) tablet 1 mg  1 mg Oral BID Hamzah Savoca, MD       Followed by   Melene Muller ON 10/10/2022] LORazepam (ATIVAN) tablet 1 mg  1 mg Oral Daily Tyreon Frigon, MD       magnesium hydroxide (MILK OF MAGNESIA) suspension 30 mL  30 mL Oral Daily PRN Ardis Hughs, NP   30 mL at 10/06/22 1509   multivitamin with minerals tablet 1 tablet  1 tablet Oral Daily Ardis Hughs, NP   1  tablet at 10/07/22 0900   nicotine (NICODERM CQ - dosed in mg/24 hours) patch 14 mg  14 mg Transdermal Daily Terrica Duecker, MD   14 mg at 10/07/22 0900   ondansetron (ZOFRAN-ODT) disintegrating tablet 4 mg  4 mg Oral Q6H PRN Ardis Hughs, NP       thiamine (Vitamin B-1) tablet 100 mg  100 mg Oral Daily Ardis Hughs,  NP   100 mg at 10/07/22 0900   traZODone (DESYREL) tablet 50 mg  50 mg Oral QHS PRN Ardis Hughs, NP   50 mg at 10/06/22 2133   venlafaxine XR (EFFEXOR-XR) 24 hr capsule 75 mg  75 mg Oral Q breakfast Simcha Farrington, Harrold Donath, MD   75 mg at 10/07/22 0900    Lab Results: No results found for this or any previous visit (from the past 48 hour(s)).  Blood Alcohol level:  Lab Results  Component Value Date   ETH 242 (H) 10/04/2022   ETH <10 10/06/2021    Metabolic Disorder Labs: Lab Results  Component Value Date   HGBA1C 4.8 10/04/2022   MPG 91.06 10/04/2022   MPG 93.93 10/06/2021   Lab Results  Component Value Date   PROLACTIN 6.8 11/16/2015   Lab Results  Component Value Date   CHOL 226 (H) 10/04/2022   TRIG 114 10/04/2022   HDL 68 10/04/2022   CHOLHDL 3.3 10/04/2022   VLDL 23 10/04/2022   LDLCALC 135 (H) 10/04/2022   LDLCALC 89 10/06/2021    Physical Findings: AIMS:  , ,  ,  ,    CIWA:  CIWA-Ar Total: 6 COWS:     Musculoskeletal: Strength & Muscle Tone: within normal limits Gait & Station: normal Patient leans: N/A  Psychiatric Specialty Exam:  Presentation  General Appearance:  Casual  Eye Contact: Good  Speech: Slow  Speech Volume: Decreased  Handedness: Right   Mood and Affect  Mood: Depressed; Anxious; Worthless; Hopeless  Affect: Congruent; Restricted   Thought Process  Thought Processes: Linear  Descriptions of Associations:Intact  Orientation:Full (Time, Place and Person)  Thought Content:Logical  History of Schizophrenia/Schizoaffective disorder:No  Duration of Psychotic Symptoms:No data  recorded Hallucinations:Hallucinations: Auditory  Ideas of Reference:None  Suicidal Thoughts:Suicidal Thoughts: Yes, Passive SI Passive Intent and/or Plan: Without Intent; Without Plan  Homicidal Thoughts:Homicidal Thoughts: No   Sensorium  Memory: Immediate Good; Recent Good; Remote Good  Judgment: Impaired  Insight: Lacking   Executive Functions  Concentration: Fair  Attention Span: Fair  Recall: Good  Fund of Knowledge: Good  Language: Good   Psychomotor Activity  Psychomotor Activity: Psychomotor Activity: Normal   Assets  Assets: Communication Skills; Desire for Improvement; Housing; Leisure Time; Physical Health; Resilience; Social Support   Sleep  Sleep: Sleep: Fair    Physical Exam: Physical Exam Vitals reviewed.  Pulmonary:     Effort: Pulmonary effort is normal.  Neurological:     Motor: No weakness.     Gait: Gait normal.    Review of Systems  Psychiatric/Behavioral:  Positive for depression, substance abuse and suicidal ideas. The patient is nervous/anxious.    Blood pressure 133/84, pulse 98, temperature 99.3 F (37.4 C), temperature source Oral, resp. rate 16, height 5\' 5"  (1.651 m), weight 92.5 kg, SpO2 99 %. Body mass index is 33.95 kg/m.   Treatment Plan Summary: ASSESSMENT:   Diagnoses / Active Problems: MDD severe recurrent without psychotic features GAD Alcohol use disorder PTSD     PLAN: Safety and Monitoring:             --  Voluntary admission to inpatient psychiatric unit for safety, stabilization and treatment             -- Daily contact with patient to assess and evaluate symptoms and progress in treatment             -- Patient's case to be discussed in multi-disciplinary team meeting             --  Observation Level : q15 minute checks             -- Vital signs:  q12 hours             -- Precautions: suicide, elopement, and assault   2. Psychiatric Diagnoses and Treatment:                -Continue CIWA with lorazepam taper -Increase Effexor to 75 mg once daily today for MDD, GAD, PTSD -Continue trazodone 50 mg nightly PRN for mood and insomnia -Continue gabapentin 100 mg 3 times daily for anxiety, mood stabilization, and alcohol use disorder   -EKG reviewed    --  The risks/benefits/side-effects/alternatives to this medication were discussed in detail with the patient and time was given for questions. The patient consents to medication trial.                -- Metabolic profile and EKG monitoring obtained while on an atypical antipsychotic (BMI: Lipid Panel: HbgA1c: QTc:)              -- Encouraged patient to participate in unit milieu and in scheduled group therapies              -- Short Term Goals: Ability to identify changes in lifestyle to reduce recurrence of condition will improve, Ability to verbalize feelings will improve, Ability to disclose and discuss suicidal ideas, Ability to demonstrate self-control will improve, Ability to identify and develop effective coping behaviors will improve, Ability to maintain clinical measurements within normal limits will improve, Compliance with prescribed medications will improve, and Ability to identify triggers associated with substance abuse/mental health issues will improve             -- Long Term Goals: Improvement in symptoms so as ready for discharge                3. Medical Issues Being Addressed:              Tobacco Use Disorder             -- Nicotine patch 14 mg/24 hours ordered             -- Smoking cessation encouraged   4. Discharge Planning:              -- Social work and case management to assist with discharge planning and identification of hospital follow-up needs prior to discharge             -- Estimated LOS: 5-7 days             -- Discharge Concerns: Need to establish a safety plan; Medication compliance and effectiveness             -- Discharge Goals: Return home with outpatient referrals for  mental health follow-up including medication management/psychotherapy     Cristy Hilts, MD 10/07/2022, 5:48 PM   Total Time Spent in Direct Patient Care:  I personally spent 35 minutes on the unit in direct patient care. The direct patient care time included face-to-face time with the patient, reviewing the patient's chart, communicating with other professionals, and coordinating care. Greater than 50% of this time was spent in counseling or coordinating care with the patient regarding goals of hospitalization, psycho-education, and discharge planning needs.   Phineas Inches, MD Psychiatrist

## 2022-10-07 NOTE — Progress Notes (Signed)
   10/07/22 2345  Psych Admission Type (Psych Patients Only)  Admission Status Voluntary  Psychosocial Assessment  Patient Complaints Substance abuse;Depression  Eye Contact Fair  Facial Expression Flat  Affect Appropriate to circumstance  Speech Logical/coherent  Interaction Assertive  Motor Activity Slow  Appearance/Hygiene Unremarkable  Behavior Characteristics Cooperative  Mood Depressed  Aggressive Behavior  Effect No apparent injury  Thought Process  Coherency WDL  Content WDL  Delusions WDL  Perception WDL  Hallucination None reported or observed  Judgment WDL  Confusion WDL  Danger to Self  Current suicidal ideation? Denies  Danger to Others  Danger to Others None reported or observed

## 2022-10-07 NOTE — Progress Notes (Signed)
The patient rated his day as a 5 out of 10 since he is feeling better. His coping skill is to read.

## 2022-10-08 DIAGNOSIS — F332 Major depressive disorder, recurrent severe without psychotic features: Secondary | ICD-10-CM

## 2022-10-08 NOTE — Progress Notes (Signed)
Adult Psychoeducational Group Note  Date:  10/08/2022 Time:  9:25 PM  Group Topic/Focus:  AA Group  Participation Level:  Active  Participation Quality:  Appropriate  Affect:  Appropriate  Cognitive:  Appropriate  Insight: Appropriate  Engagement in Group:  Engaged  Modes of Intervention:  Discussion and Support  Additional Comments:   Pt attended and was attentive during the [place] group.  Wetzel Bjornstad Leanord Thibeau 10/08/2022, 9:25 PM

## 2022-10-08 NOTE — [facility] Group Notes (Signed)
Adult Psychoeducational Group Note  Date:  10/08/2022 Time:  2:38 PM  Group Topic/Focus:  Wellness Toolbox:   The focus of this group is to discuss various aspects of wellness, balancing those aspects and exploring ways to increase the ability to experience wellness.  Patients will create a wellness toolbox for use upon discharge.  Participation Level:  Active  Participation Quality:  Attentive  Affect:  Appropriate  Cognitive:  Alert  Insight: Appropriate  Engagement in Group:  Engaged  Modes of Intervention:  Activity  Additional Comments:  Patient attended and participated in the therapeutic group activity.  Annie Sable 10/08/2022, 2:38 PM

## 2022-10-08 NOTE — Progress Notes (Signed)
   10/08/22 0533  Sleep  Number of Hours 7

## 2022-10-08 NOTE — Progress Notes (Signed)
[facility] MD Progress Note  10/08/2022 4:36 PM [place]  MRN:  [id number]    Subjective:   Patient is a 26 year old male with a psychiatric history of MDD, she AD, PTSD, borderline personality disorder, substance abuse (in addition to reported history of ADHD and documented history of ODD in the chart), who was admitted to the psychiatric unit from the [facility] for evaluation of worsening depression and suicidal thoughts.  Patient reports he was discharged from this hospital about 1 year ago, and and took his medication for the 30 days that he had prescribed, but did not follow up at the [facility] due to limited walk-in hours and working in the morning during these walk-in hours.  He reports he was therefore unable to continue taking his medications.    On assessment today the pt reports that mood is less depressed. Reports better sleep. Reports appetite is better. Reports concentration is better. Reports anxiety is improving. Reports passive SI has ceased within the last 24 hours.  Denies s/e to restarting meds.  Reports less alcohol w/d sx since starting ativan taper. Reports fatigue 2/2 ativan.    Principal Problem: MDD (major depressive disorder), recurrent severe, without psychosis ([place]) Diagnosis: Principal Problem:   MDD (major depressive disorder), recurrent severe, without psychosis ([place]) Active Problems:   PTSD (post-traumatic stress disorder)   Alcohol use disorder  Total Time spent with patient: 20 minutes  Past Psychiatric History:  Previously diagnosed with MDD, GAD, PTSD. Reports previous diagnosis of ADHD.  Per chart there is a history of ODD. Patient reports a history of multiple psychiatric hospitalizations in the past Patient reports a history of multiple suicide attempts in the past by overdose. Does not have a current psychiatrist. Current psychiatric medications: None Past psychiatric medication history: Lithium, Adderall, Prozac, Wellbutrin, Lexapro, Zoloft,  Effexor, gabapentin, trazodone, Abilify, Remeron    Past Medical History:  Past Medical History:  Diagnosis Date   ADHD (attention deficit hyperactivity disorder)    Anxiety    Asthma    Constipation    Deliberate self-cutting    Depression    IBS (irritable bowel syndrome)    Irritable bowel syndrome    Post traumatic stress disorder (PTSD)    Suicide attempt ([place])    Vision abnormalities    Pt states he wears glasses   History reviewed. No pertinent surgical history. Family History:  Family History  Problem Relation Age of Onset   ADD / ADHD Brother    Hirschsprung's disease Neg Hx    Family Psychiatric  History:  Patient reports that multiple members on his mother's side of the family struggle from depression.  Reports mother attempted suicide.       Social History:  Social History   Substance and Sexual Activity  Alcohol Use Not Currently   Comment: Pt states he drinks on occassion     Social History   Substance and Sexual Activity  Drug Use No   Types: Oxycodone   Comment: last used in 7th grade when he "tried it"/used oxycodone last 2015    Social History   Socioeconomic History   Marital status: Single    Spouse name: Not on file   Number of children: Not on file   Years of education: Not on file   Highest education level: Not on file  Occupational History   Not on file  Tobacco Use   Smoking status: Every Day    Packs/day: 1.50    Types: Cigarettes, Cigars   Smokeless  tobacco: Never  Vaping Use   Vaping Use: Never used  Substance and Sexual Activity   Alcohol use: Not Currently    Comment: Pt states he drinks on occassion   Drug use: No    Types: Oxycodone    Comment: last used in 7th grade when he "tried it"/used oxycodone last 2015   Sexual activity: Never  Other Topics Concern   Not on file  Social History Narrative   ** Merged History Encounter **       9th grade         Social Determinants of Health   Financial Resource  Strain: Not on file  Food Insecurity: No Food Insecurity (10/05/2022)   Hunger Vital Sign    Worried About Running Out of Food in the Last Year: Never true    Ran Out of Food in the Last Year: Never true  Transportation Needs: No Transportation Needs (10/05/2022)   PRAPARE - Hydrologist (Medical): No    Lack of Transportation (Non-Medical): No  Physical Activity: Not on file  Stress: Not on file  Social Connections: Not on file   Additional Social History:                         Sleep: Fair  Appetite:  Fair  Current Medications: Current Facility-Administered Medications  Medication Dose Route Frequency Provider Last Rate Last Admin   acetaminophen (TYLENOL) tablet 650 mg  650 mg Oral Q6H PRN Revonda Humphrey, NP       alum & mag hydroxide-simeth (MAALOX/MYLANTA) 200-200-20 MG/5ML suspension 30 mL  30 mL Oral Q4H PRN Revonda Humphrey, NP       gabapentin (NEURONTIN) capsule 100 mg  100 mg Oral TID Janine Limbo, MD   100 mg at 10/08/22 1240   LORazepam (ATIVAN) tablet 1 mg  1 mg Oral TID Janine Limbo, MD   1 mg at 10/08/22 1243   Followed by   Derrill Memo ON 10/09/2022] LORazepam (ATIVAN) tablet 1 mg  1 mg Oral BID Louvenia Golomb, Ovid Curd, MD       Followed by   Derrill Memo ON 10/10/2022] LORazepam (ATIVAN) tablet 1 mg  1 mg Oral Daily Adrien Shankar, MD       magnesium hydroxide (MILK OF MAGNESIA) suspension 30 mL  30 mL Oral Daily PRN Revonda Humphrey, NP   30 mL at 10/07/22 1838   multivitamin with minerals tablet 1 tablet  1 tablet Oral Daily Revonda Humphrey, NP   1 tablet at 10/08/22 0930   nicotine (NICODERM CQ - dosed in mg/24 hours) patch 14 mg  14 mg Transdermal Daily Metha Kolasa, Ovid Curd, MD   14 mg at 10/08/22 0931   thiamine (Vitamin B-1) tablet 100 mg  100 mg Oral Daily Thomes Lolling H, NP   100 mg at 10/08/22 0930   traZODone (DESYREL) tablet 50 mg  50 mg Oral QHS PRN Revonda Humphrey, NP   50 mg at 10/07/22 2120    venlafaxine XR (EFFEXOR-XR) 24 hr capsule 75 mg  75 mg Oral Q breakfast Elige Shouse, Ovid Curd, MD   75 mg at 10/08/22 0930    Lab Results: No results found for this or any previous visit (from the past 48 hour(s)).  Blood Alcohol level:  Lab Results  Component Value Date   ETH 242 (H) 10/04/2022   ETH <10 66/44/0347    Metabolic Disorder Labs: Lab Results  Component Value Date  HGBA1C 4.8 10/04/2022   MPG 91.06 10/04/2022   MPG 93.93 10/06/2021   Lab Results  Component Value Date   PROLACTIN 6.8 11/16/2015   Lab Results  Component Value Date   CHOL 226 (H) 10/04/2022   TRIG 114 10/04/2022   HDL 68 10/04/2022   CHOLHDL 3.3 10/04/2022   VLDL 23 10/04/2022   LDLCALC 135 (H) 10/04/2022   LDLCALC 89 10/06/2021    Physical Findings: AIMS:  , ,  ,  ,    CIWA:  CIWA-Ar Total: 2 COWS:     Musculoskeletal: Strength & Muscle Tone: within normal limits Gait & Station: normal Patient leans: N/A  Psychiatric Specialty Exam:  Presentation  General Appearance:  Casual  Eye Contact: Good  Speech: Slow  Speech Volume: Decreased  Handedness: Right   Mood and Affect  Mood: Less depressed  Affect: Congruent   Thought Process  Thought Processes: Linear  Descriptions of Associations:Intact  Orientation:Full (Time, Place and Person)  Thought Content:Logical  History of Schizophrenia/Schizoaffective disorder:No data recorded  Duration of Psychotic Symptoms:No data recorded Hallucinations:No data recorded Denies AH, VH  Ideas of Reference:None  Suicidal Thoughts:No data recorded Denies  Homicidal Thoughts:No data recorded Denies   Sensorium  Memory: Immediate Good; Recent Good; Remote Good  Judgment: Impaired  Insight: Lacking   Executive Functions  Concentration: Fair  Attention Span: Fair  Recall: Good  Fund of Knowledge: Good  Language: Good   Psychomotor Activity  Psychomotor Activity: No data recorded   Assets   Assets: Communication Skills; Desire for Improvement; Housing; Leisure Time; Physical Health; Resilience; Social Support   Sleep  Sleep: No data recorded    Physical Exam: Physical Exam Vitals reviewed.  Pulmonary:     Effort: Pulmonary effort is normal.  Neurological:     Mental Status: He is alert.     Motor: No weakness.     Gait: Gait normal.    Review of Systems  Psychiatric/Behavioral:  Positive for depression, substance abuse and suicidal ideas. The patient is nervous/anxious.   All other systems reviewed and are negative.  Blood pressure 128/78, pulse 82, temperature 99.3 F (37.4 C), temperature source Oral, resp. rate 16, height 5\' 5"  (1.651 m), weight 92.5 kg, SpO2 100 %. Body mass index is 33.95 kg/m.   Treatment Plan Summary: ASSESSMENT:   Diagnoses / Active Problems: MDD severe recurrent without psychotic features GAD Alcohol use disorder PTSD     PLAN: Safety and Monitoring:             --  Voluntary admission to inpatient psychiatric unit for safety, stabilization and treatment             -- Daily contact with patient to assess and evaluate symptoms and progress in treatment             -- Patient's case to be discussed in multi-disciplinary team meeting             -- Observation Level : q15 minute checks             -- Vital signs:  q12 hours             -- Precautions: suicide, elopement, and assault   2. Psychiatric Diagnoses and Treatment:               -Continue CIWA with lorazepam taper -Continue Effexor 75 mg once daily today for MDD, GAD, PTSD -Continue trazodone 50 mg nightly PRN for mood and insomnia -Continue gabapentin 100  mg 3 times daily for anxiety, mood stabilization, and alcohol use disorder      --  The risks/benefits/side-effects/alternatives to this medication were discussed in detail with the patient and time was given for questions. The patient consents to medication trial.                -- Metabolic profile and  EKG monitoring obtained while on an atypical antipsychotic (BMI: Lipid Panel: HbgA1c: QTc:)              -- Encouraged patient to participate in unit milieu and in scheduled group therapies              -- Short Term Goals: Ability to identify changes in lifestyle to reduce recurrence of condition will improve, Ability to verbalize feelings will improve, Ability to disclose and discuss suicidal ideas, Ability to demonstrate self-control will improve, Ability to identify and develop effective coping behaviors will improve, Ability to maintain clinical measurements within normal limits will improve, Compliance with prescribed medications will improve, and Ability to identify triggers associated with substance abuse/mental health issues will improve             -- Long Term Goals: Improvement in symptoms so as ready for discharge                3. Medical Issues Being Addressed:              Tobacco Use Disorder             -- Nicotine patch 14 mg/24 hours ordered             -- Smoking cessation encouraged   4. Discharge Planning:              -- Social work and case management to assist with discharge planning and identification of hospital follow-up needs prior to discharge             -- Estimated LOS: 5-7 days             -- Discharge Concerns: Need to establish a safety plan; Medication compliance and effectiveness             -- Discharge Goals: Return home with outpatient referrals for mental health follow-up including medication management/psychotherapy     Christoper Allegra, MD 10/08/2022, 4:36 PM   Total Time Spent in Direct Patient Care:  I personally spent 25 minutes on the unit in direct patient care. The direct patient care time included face-to-face time with the patient, reviewing the patient's chart, communicating with other professionals, and coordinating care. Greater than 50% of this time was spent in counseling or coordinating care with the patient regarding goals of  hospitalization, psycho-education, and discharge planning needs.   Janine Limbo, MD Psychiatrist

## 2022-10-08 NOTE — Group Note (Signed)
[facility] LCSW Group Therapy Note   Group Date: 10/08/2022 Start Time: 1300 End Time: 1400   Type of Therapy/Topic:  Group Therapy:  Emotion Regulation  Participation Level:  Active   Mood: Euthymic   Description of Group:    The purpose of this group is to assist patients in learning to regulate negative emotions and experience positive emotions. Patients will be guided to discuss ways in which they have been vulnerable to their negative emotions. These vulnerabilities will be juxtaposed with experiences of positive emotions or situations, and patients challenged to use positive emotions to combat negative ones. Special emphasis will be placed on coping with negative emotions in conflict situations, and patients will process healthy conflict resolution skills.  Therapeutic Goals: Patient will identify two positive emotions or experiences to reflect on in order to balance out negative emotions:  Patient will label two or more emotions that they find the most difficult to experience:  Patient will be able to demonstrate positive conflict resolution skills through discussion or role plays:   Summary of Patient Progress:   Patient was present for the entirety of the group session. Patient was an active listener and participated in the topic of discussion, provided helpful advice to others, and added nuance to topic of conversation. Patient provided emotional encouragement. Participated in role playing exercise and provided good feedback.     Therapeutic Modalities:   Cognitive Behavioral Therapy Feelings Identification Dialectical Behavioral Therapy   Durenda Hurt, [place]

## 2022-10-08 NOTE — Plan of Care (Signed)
  Problem: Coping: Goal: Coping ability will improve Outcome: Progressing   Problem: Health Behavior/Discharge Planning: Goal: Identification of resources available to assist in meeting health care needs will improve Outcome: Progressing   Problem: Medication: Goal: Compliance with prescribed medication regimen will improve Outcome: Progressing   Problem: Education: Goal: Emotional status will improve Outcome: Progressing Goal: Mental status will improve Outcome: Progressing

## 2022-10-08 NOTE — Group Note (Signed)
Recreation Therapy Group Note   Group Topic:Team Building  Group Date: 10/08/2022 Start Time: 0930 End Time: 0955 Facilitators: Kare Dado-McCall, LRT,CTRS Location: [address] Dayroom   Goal Area(s) Addresses:  Patient will effectively work with peer towards shared goal.  Patient will identify skills used to make activity successful.  Patient will identify how skills used during activity can be applied to reach post d/c goals.   Group Description: [employer]. In teams of 5-6, patients were given 12 craft pipe cleaners. Using the materials provided, patients were instructed to compete again the opposing team(s) to build the tallest free-standing structure from floor level. The activity was timed; difficulty increased by Probation officer as Pharmacist, hospital continued.  Systematically resources were removed with additional directions for example, placing one arm behind their back, working in silence, and shape stipulations. LRT facilitated post-activity discussion reviewing team processes and necessary communication skills involved in completion. Patients were encouraged to reflect how the skills utilized, or not utilized, in this activity can be incorporated to positively impact support systems post discharge.   Affect/Mood: Appropriate   Participation Level: Engaged   Participation Quality: Independent   Behavior: Appropriate   Speech/Thought Process: Focused   Insight: Good   Judgement: Good   Modes of Intervention: STEM Activity   Patient Response to Interventions:  Engaged   Education Outcome:  Acknowledges education and In group clarification offered    Clinical Observations/Individualized Feedback: Pt engaged and worked well with peers in completing activity.   Plan: Continue to engage patient in RT group sessions 2-3x/week.   Ger Ringenberg-McCall, LRT,CTRS 10/08/2022 10:43 AM

## 2022-10-08 NOTE — Progress Notes (Signed)
Pt denied SI/HI/AVH this morning. Pt rated his anxiety a 7/10 this morning at medication window. Pt has been pleasant and cooperative throughout the shift. RN provided support and encouragement to patient. Pt given scheduled medications as prescribed. Q15 min checks verified for safety. RN will continue to monitor pt's progress and provide assistance as indicated. Pt is safe on the unit.    10/08/22 1142  Psych Admission Type (Psych Patients Only)  Admission Status Voluntary  Psychosocial Assessment  Patient Complaints Anxiety;Depression  Eye Contact Fair  Facial Expression Flat  Affect Appropriate to circumstance  Speech Logical/coherent  Interaction Cautious  Motor Activity Slow  Appearance/Hygiene Unremarkable  Behavior Characteristics Cooperative;Calm  Mood Anxious;Depressed  Thought Process  Coherency WDL  Content WDL  Delusions None reported or observed  Perception WDL  Hallucination None reported or observed  Judgment WDL  Confusion None  Danger to Self  Current suicidal ideation? Denies  Agreement Not to Harm Self Yes  Description of Agreement Verbal  Danger to Others  Danger to Others None reported or observed

## 2022-10-09 MED ORDER — WHITE PETROLATUM EX OINT
TOPICAL_OINTMENT | CUTANEOUS | Status: AC
  Filled 2022-10-09: qty 5

## 2022-10-09 MED ORDER — TRAZODONE HCL 50 MG PO TABS
50.0000 mg | ORAL_TABLET | Freq: Every evening | ORAL | Status: DC | PRN
  Filled 2022-10-09: qty 14
  Filled 2022-10-09: qty 1

## 2022-10-09 NOTE — Progress Notes (Signed)
Adult Psychoeducational Group Note  Date:  10/09/2022 Time:  2:43 PM  Group Topic/Focus:  Self Care:   The focus of this group is to help patients understand the importance of self-care in order to improve or restore emotional, physical, spiritual, interpersonal, and financial health.  Participation Level:  Active  Participation Quality:  Appropriate  Affect:  Appropriate  Cognitive:  Appropriate  Insight: Appropriate  Engagement in Group:  Engaged  Modes of Intervention:  Discussion  Additional Comments:  Patient attended afternoon therapeutic group and participated.   Bintou Lafata W Piya Mesch 40/98/1191, 2:43 PM

## 2022-10-09 NOTE — Progress Notes (Signed)
   10/09/22 0519  Sleep  Number of Hours 6.25

## 2022-10-09 NOTE — Progress Notes (Signed)
[facility] MD Progress Note  10/09/2022 12:39 PM [name]  MRN:  [id number]  Principal Problem: MDD (major depressive disorder), recurrent severe, without psychosis ([place]) Diagnosis: Principal Problem:   MDD (major depressive disorder), recurrent severe, without psychosis ([place]) Active Problems:   PTSD (post-traumatic stress disorder)   Alcohol use disorder  Patient is a 26 year old male with a psychiatric history of MDD, she AD, PTSD, borderline personality disorder, substance abuse (in addition to reported history of ADHD and documented history of ODD in the chart), who was admitted to the psychiatric unit from the [facility] for evaluation of worsening depression and suicidal thoughts.    Interval History Patient was seen today for re-evaluation.  Nursing reports no events overnight. The patient has no issues with performing ADLs.  Patient has been medication compliant.    Patient was seen and interviewed by attending psychiatrist. Chart reviewed. Patient discussed during treatment team rounds.  Subjective:  On assessment patient reports "feeling well". He reports good mood and feeling much less depressed, less anxious. He reports good sleep and appetite. He denies any symptoms of psychosis - denies auditory or visual hallucinations, denies feeling paranoid, unsafe, does not express any delusions. He denies thoughts or plans of hurting self or others. He reports no side effects from medications he is getting here. He denies any physical complaints. He is asking about discharge. Says he will need letter of excuse from work for a time of hospitalization.  Labs: no new results for review.    Total Time spent with patient: 20 minutes  Past Psychiatric History: ses H&P   Past Medical History:  Past Medical History:  Diagnosis Date   ADHD (attention deficit hyperactivity disorder)    Anxiety    Asthma    Constipation    Deliberate self-cutting    Depression    IBS (irritable bowel  syndrome)    Irritable bowel syndrome    Post traumatic stress disorder (PTSD)    Suicide attempt ([place])    Vision abnormalities    Pt states he wears glasses   History reviewed. No pertinent surgical history. Family History:  Family History  Problem Relation Age of Onset   ADD / ADHD Brother    Hirschsprung's disease Neg Hx    Family Psychiatric  History: see H&P Social History:  Social History   Substance and Sexual Activity  Alcohol Use Not Currently   Comment: Pt states he drinks on occassion     Social History   Substance and Sexual Activity  Drug Use No   Types: Oxycodone   Comment: last used in 7th grade when he "tried it"/used oxycodone last 2015    Social History   Socioeconomic History   Marital status: Single    Spouse name: Not on file   Number of children: Not on file   Years of education: Not on file   Highest education level: Not on file  Occupational History   Not on file  Tobacco Use   Smoking status: Every Day    Packs/day: 1.50    Types: Cigarettes, Cigars   Smokeless tobacco: Never  Vaping Use   Vaping Use: Never used  Substance and Sexual Activity   Alcohol use: Not Currently    Comment: Pt states he drinks on occassion   Drug use: No    Types: Oxycodone    Comment: last used in 7th grade when he "tried it"/used oxycodone last 2015   Sexual activity: Never  Other Topics Concern  Not on file  Social History Narrative   ** Merged History Encounter **       9th grade         Social Determinants of Health   Financial Resource Strain: Not on file  Food Insecurity: No Food Insecurity (10/05/2022)   Hunger Vital Sign    Worried About Running Out of Food in the Last Year: Never true    Ran Out of Food in the Last Year: Never true  Transportation Needs: No Transportation Needs (10/05/2022)   PRAPARE - Administrator, Civil Service (Medical): No    Lack of Transportation (Non-Medical): No  Physical Activity: Not on file   Stress: Not on file  Social Connections: Not on file   Additional Social History:                         Sleep: Good  Appetite:  Good  Current Medications: Current Facility-Administered Medications  Medication Dose Route Frequency Provider Last Rate Last Admin   acetaminophen (TYLENOL) tablet 650 mg  650 mg Oral Q6H PRN Ardis Hughs, NP       alum & mag hydroxide-simeth (MAALOX/MYLANTA) 200-200-20 MG/5ML suspension 30 mL  30 mL Oral Q4H PRN Ardis Hughs, NP       gabapentin (NEURONTIN) capsule 100 mg  100 mg Oral TID Phineas Inches, MD   100 mg at 10/09/22 1203   LORazepam (ATIVAN) tablet 1 mg  1 mg Oral BID Massengill, Harrold Donath, MD   1 mg at 10/09/22 0754   Followed by   Melene Muller ON 10/10/2022] LORazepam (ATIVAN) tablet 1 mg  1 mg Oral Daily Massengill, Nathan, MD       magnesium hydroxide (MILK OF MAGNESIA) suspension 30 mL  30 mL Oral Daily PRN Ardis Hughs, NP   30 mL at 10/07/22 1838   multivitamin with minerals tablet 1 tablet  1 tablet Oral Daily Ardis Hughs, NP   1 tablet at 10/09/22 6440   nicotine (NICODERM CQ - dosed in mg/24 hours) patch 14 mg  14 mg Transdermal Daily Massengill, Harrold Donath, MD   14 mg at 10/09/22 0754   thiamine (Vitamin B-1) tablet 100 mg  100 mg Oral Daily Ardis Hughs, NP   100 mg at 10/09/22 0752   traZODone (DESYREL) tablet 50 mg  50 mg Oral QHS PRN Ardis Hughs, NP   50 mg at 10/08/22 2128   venlafaxine XR (EFFEXOR-XR) 24 hr capsule 75 mg  75 mg Oral Q breakfast Massengill, Harrold Donath, MD   75 mg at 10/09/22 3474    Lab Results: No results found for this or any previous visit (from the past 48 hour(s)).  Blood Alcohol level:  Lab Results  Component Value Date   ETH 242 (H) 10/04/2022   ETH <10 10/06/2021    Metabolic Disorder Labs: Lab Results  Component Value Date   HGBA1C 4.8 10/04/2022   MPG 91.06 10/04/2022   MPG 93.93 10/06/2021   Lab Results  Component Value Date   PROLACTIN 6.8 11/16/2015    Lab Results  Component Value Date   CHOL 226 (H) 10/04/2022   TRIG 114 10/04/2022   HDL 68 10/04/2022   CHOLHDL 3.3 10/04/2022   VLDL 23 10/04/2022   LDLCALC 135 (H) 10/04/2022   LDLCALC 89 10/06/2021    Physical Findings: AIMS:  , ,  ,  ,    CIWA:  CIWA-Ar Total: 0 COWS:  Musculoskeletal: Strength & Muscle Tone: within normal limits Gait & Station: normal Patient leans: N/A  Psychiatric Specialty Exam:  Presentation  General Appearance:  Casual  Eye Contact: Good  Speech: wnl  Speech Volume: wnl  Handedness: Right   Mood and Affect  Mood: "Good today"  Affect: full   Thought Process  Thought Processes: Linear  Descriptions of Associations:Intact  Orientation:Full (Time, Place and Person)  Thought Content:Logical  History of Schizophrenia/Schizoaffective disorder:No data recorded Duration of Psychotic Symptoms:No data recorded Hallucinations:No data recorded Ideas of Reference:None  Suicidal Thoughts:No data recorded Homicidal Thoughts:No data recorded  Sensorium  Memory: Immediate Good; Recent Good; Remote Good  Judgment: fair  Insight: fair   Executive Functions  Concentration: Fair  Attention Span: Fair  Recall: Good  Fund of Knowledge: Good  Language: Good   Psychomotor Activity  Psychomotor Activity:No data recorded  Assets  Assets: Communication Skills; Desire for Improvement; Housing; Leisure Time; Physical Health; Resilience; Social Support   Sleep  Sleep:No data recorded   Physical Exam: Physical Exam ROS Blood pressure 116/80, pulse 75, temperature 97.6 F (36.4 C), temperature source Oral, resp. rate 16, height 5\' 5"  (1.651 m), weight 92.5 kg, SpO2 98 %. Body mass index is 33.95 kg/m.   Treatment Plan Summary: Daily contact with patient to assess and evaluate symptoms and progress in treatment and Medication management  Patient is a 26 year old male with the above-stated past  psychiatric history who is seen in follow-up.  Chart reviewed. Patient discussed with nursing. Patient reports mood improvement, denies unsafe thoughts and thinks he can be ready for discharge soon.  Diagnoses/ Active problems: MDD severe recurrent without psychotic features GAD Alcohol use disorder PTSD  PLAN:  Safety and Monitoring: continue inpatient psych admission; 15-minute checks; daily contact with patient to assess and evaluate symptoms and progress in treatment; psychoeducation.Vital signs: q12 hours. Precautions: suicide, elopement, and assault.   Psychiatric Problems: -Continue CIWA with lorazepam taper -Continue Effexor 75 mg once daily today for MDD, GAD, PTSD -Continue trazodone 50 mg nightly PRN for mood and insomnia -Continue gabapentin 100 mg 3 times daily for anxiety, mood stabilization, and alcohol use disorder       --  The risks/benefits/side-effects/alternatives to this medication were discussed in detail with the patient and time was given for questions. The patient consents to medication trial.                -- Metabolic profile and EKG monitoring obtained while on an atypical antipsychotic (BMI: Lipid Panel: HbgA1c: QTc:)              -- Encouraged patient to participate in unit milieu and in scheduled group therapies    Medical Problems: Tobacco Use Disorder             -- Nicotine patch 14 mg/24 hours ordered             -- Smoking cessation encouraged   Discharge Planning: -Social work and case management to assist with discharge planning and identification of hospital follow-up needs prior to discharge -Estimated LOS: 7 days -Discharge Concerns: Need to establish a safety plan; Medication compliance and effectiveness -Discharge Goals: Return home with outpatient referrals for mental health follow-up including medication management/psychotherapy  Total Time Spent in Direct Patient Care:  I personally spent 25 minutes on the unit in direct patient  care. The direct patient care time included face-to-face time with the patient, reviewing the patient's chart, communicating with other professionals, and coordinating care. Greater than  50% of this time was spent in counseling or coordinating care with the patient regarding goals of hospitalization, psycho-education, and discharge planning needs.    Thalia Party, MD 10/09/2022, 12:39 PM

## 2022-10-09 NOTE — Group Note (Signed)
LCSW Group Therapy Note  10/09/2022      Topic:  Anger Healthy and Unhealthy Coping Skills  Participation Level:  Active  Description of Group:   In this group, patients identified their own common triggers and typical reactions then analyzed how these reactions are possibly beneficial and possibly unhelpful.  Focus was placed on examining whether typical coping skills are healthy or unhealthy.  Therapeutic Goals: Patients will share situations that commonly incite their anger and how they typically respond Patients will identify how their coping skills work for them and/or against them Patients will explore possible alternative coping skills Patients will learn that anger itself is normal and that healthier reactions can assist with resolving conflict rather than worsening situations  Summary of Patient Progress:  The patient shared that his frequent cause of anger is when he feels dismissed or not heard.  Choice of coping skill is often to get into an argument.  He left early so did not participate in the discussion of whether the chosen coping skill was healthy or unhealthy.  He returned just before the ending of group.  Therapeutic Modalities:   Cognitive Behavioral Therapy Processing  Maretta Los, LCSW

## 2022-10-09 NOTE — Progress Notes (Signed)
Pt denied SI/HI/AVH this morning. Pt reported that he had no depression or anxiety this morning. Pt seen interacting on the unit and participating in groups throughout the day. Pt has been calm and cooperative throughout the day. Pt given scheduled medications as prescribed. Q15 min checks verified for safety. Pt is safe on the unit.    10/09/22 1125  Psych Admission Type (Psych Patients Only)  Admission Status Voluntary  Psychosocial Assessment  Patient Complaints None  Eye Contact Fair  Facial Expression Flat  Affect Appropriate to circumstance  Speech Logical/coherent  Interaction Minimal  Motor Activity Slow  Appearance/Hygiene Unremarkable  Behavior Characteristics Cooperative;Calm  Mood Depressed  Thought Process  Coherency WDL  Content WDL  Delusions None reported or observed  Perception WDL  Hallucination None reported or observed  Judgment WDL  Confusion None  Danger to Self  Current suicidal ideation? Denies  Agreement Not to Harm Self Yes  Description of Agreement Verbal  Danger to Others  Danger to Others None reported or observed

## 2022-10-09 NOTE — Progress Notes (Signed)
   10/09/22 2154  Psych Admission Type (Psych Patients Only)  Admission Status Voluntary  Psychosocial Assessment  Patient Complaints None  Eye Contact Fair  Facial Expression Animated  Affect Appropriate to circumstance  Speech Logical/coherent  Interaction Minimal  Motor Activity Other (Comment) (WDL)  Appearance/Hygiene Unremarkable  Behavior Characteristics Appropriate to situation  Mood Pleasant  Thought Process  Coherency WDL  Content WDL  Delusions None reported or observed  Perception WDL  Hallucination None reported or observed  Judgment WDL  Confusion None  Danger to Self  Current suicidal ideation? Denies  Agreement Not to Harm Self Yes  Description of Agreement verbal  Danger to Others  Danger to Others None reported or observed

## 2022-10-09 NOTE — [facility] Group Notes (Signed)
Adult Psychoeducational Group Note  Date:  10/09/2022 Time:  9:22 PM  Group Topic/Focus:  Wrap-Up Group:   The focus of this group is to help patients review their daily goal of treatment and discuss progress on daily workbooks.  Participation Level:  Active  Participation Quality:  Appropriate and Attentive  Affect:  Appropriate  Cognitive:  Appropriate  Insight: Appropriate  Engagement in Group:  Engaged and Supportive  Modes of Intervention:  Discussion and Support  Additional Comments:  Pt attended and engaged in wrap up group. Pt goal was to prepare for discharge. Pt reports that gym and dinner were the highlight of the day. Pt rates his day 9/10.   Jilliann Subramanian Lucy [place] 10/09/2022, 9:22 PM

## 2022-10-10 MED ORDER — VENLAFAXINE HCL ER 75 MG PO CP24: 75.0000 mg | ORAL_CAPSULE | Freq: Every day | ORAL | 0 refills | Status: AC

## 2022-10-10 MED ORDER — GABAPENTIN 100 MG PO CAPS: 100.0000 mg | ORAL_CAPSULE | Freq: Three times a day (TID) | ORAL | 0 refills | Status: AC

## 2022-10-10 MED ORDER — TRAZODONE HCL 50 MG PO TABS: 50.0000 mg | ORAL_TABLET | Freq: Every evening | ORAL | 0 refills | Status: AC | PRN

## 2022-10-10 MED ORDER — NICOTINE 14 MG/24HR TD PT24: 14.0000 mg | MEDICATED_PATCH | Freq: Every day | TRANSDERMAL | 0 refills | Status: AC

## 2022-10-10 NOTE — Progress Notes (Signed)
Discharge Note:  Patient discharged home via [place].  Patient denied SI and HI. Denied A/V hallucinations. Suicide prevention information given and discussed with patient who stated they understood and had no questions. Patient stated they received all their belongings, clothing, toiletries, misc items, etc. Patient stated they appreciated all assistance received from [facility] staff. All required discharge information given to patient.

## 2022-10-10 NOTE — [facility] Suicide Risk Assessment (Signed)
[facility] Discharge Suicide Risk Assessment   Principal Problem: MDD (major depressive disorder), recurrent severe, without psychosis (HCC) Discharge Diagnoses: Principal Problem:   MDD (major depressive disorder), recurrent severe, without psychosis (HCC) Active Problems:   PTSD (post-traumatic stress disorder)   Alcohol use disorder   Reason for Admission: worsening depression, suicidal ideation  Hospital Summary During the patient's hospitalization, patient had extensive initial psychiatric evaluation, and follow-up psychiatric evaluations every day.   Psychiatric diagnoses provided upon initial assessment per above.   The following medications were managed: Scheduled  gabapentin  100 mg Oral TID   multivitamin with minerals  1 tablet Oral Daily   nicotine  14 mg Transdermal Daily   thiamine  100 mg Oral Daily   venlafaxine XR  75 mg Oral Q breakfast    PRN acetaminophen, alum & mag hydroxide-simeth, magnesium hydroxide, traZODone   During the hospitalization, patient had the following lab / imaging / testing abnormalities which require further evaluation / management / treatment: elevated cholesterol, elevated LDL   Patient's care was discussed during the interdisciplinary team meeting every day during the hospitalization.   The patient denies having side effects to prescribed psychiatric medication.   Patient is a 26 year old male with a psychiatric history of MDD, AD, PTSD, borderline personality disorder, substance abuse (in addition to reported history of ADHD and documented history of ODD in the chart), who was admitted to the psychiatric unit from the [facility] for evaluation of worsening depression and suicidal thoughts.   Gradually, patient started adjusting to milieu. The patient was evaluated each day by a clinical provider to ascertain response to treatment. Improvement was noted by the patient's report of decreasing symptoms, improved sleep and appetite, affect, medication  tolerance, behavior, and participation in unit programming.  Patient was asked each day to complete a self inventory noting mood, mental status, pain, new symptoms, anxiety and concerns.     Symptoms were reported as significantly decreased or resolved completely by discharge.    On day of discharge, the patient reports that their mood is stable. The patient denied having suicidal thoughts for more than 48 hours prior to discharge.  Patient denies having homicidal thoughts.  Patient denies having auditory hallucinations.  Patient denies any visual hallucinations or other symptoms of psychosis. The patient was motivated to continue taking medication with a goal of continued improvement in mental health.    The patient reports their target psychiatric symptoms of MDD (major depressive disorder), recurrent severe, without psychosis (HCC) responded well to the psychiatric medications, and the patient reports overall benefit other psychiatric hospitalization. Supportive psychotherapy was provided to the patient. The patient also participated in regular group therapy while hospitalized. Coping skills, problem solving as well as relaxation therapies were also part of the unit programming.   Labs were reviewed with the patient, and abnormal results were discussed with the patient.   The patient is able to verbalize their individual safety plan to this provider.   # It is recommended to the patient to continue psychiatric medications as prescribed, after discharge from the hospital.     # It is recommended to the patient to follow up with your outpatient psychiatric provider and PCP.   # It was discussed with the patient, the impact of alcohol, drugs, tobacco have been there overall psychiatric and medical wellbeing, and total abstinence from substance use was recommended to the patient.   # Prescriptions provided or sent directly to preferred pharmacy at discharge. Patient agreeable to plan. Given  opportunity to ask questions. Appears to feel comfortable with discharge.    # In the event of worsening symptoms, the patient is instructed to call the crisis hotline, 911 and or go to the nearest ED for appropriate evaluation and treatment of symptoms. To follow-up with primary care provider for other medical issues, concerns and or health care needs   # Patient was discharged home with a plan to follow up as noted below.   Total Time spent with patient: 1 hour  Mental Status Exam:   Appearance and Grooming: Patient is casually dressed in t-shirt and joggers . The patient has no noticeable scent or odor.   Behavior: The patient appears in no acute distress, and during the interview, was calm, focused, required minimal redirection, and behaving appropriately to scenario; he was able to follow commands and compliant to requests and made good eye contact.   Attitude: Patient was cooperative and open during the interview.   Motor activity: The patient's movement speed was normal; his gait was normal. There was no notable abnormal facial movements and no notable abnormal extremity movements.   Speech: The patient's speech was clear, fluent, with good articulation, and with appropriately placed inflections. The volume of his speech was normal and normal in quantity. The rate was normal with a normal rhythm. Responses were normal in latency. There were no abnormal patterns in speech.   Mood: "Good"   Affect: Patient's affect is euthymic with broad range and even fluctuations; his affect is congruent with his stated mood. -------------------------------------------------------------------------------------------------------------------------   Thought Content The patient experiences no hallucinations. The patient describes no delusional thoughts; he denies thought insertion, denies thought withdrawal, denies thought interruption, and denies thought broadcasting.   Patient denies active  suicidal intent and denies passive suicidal ideation; he denies homicidal intent.   Thought Process The patient's thought process is linear and goal-directed.   Insight The patient demonstrates good insight, as evidenced by awareness of mental health conditions, substance use disorders, and mental health decompensation triggers.   Judgement The patient demonstrates good judgement, as evidenced by help-seeking behavior, such as voluntary admission to [facility], adhering to medication regimen, actively participating in individual therapy, and actively participating in group therapy.     Sleep  Sleep:No data recorded     No data recorded   Physical Exam Vitals and nursing note reviewed.  Constitutional:      Appearance: Normal appearance.  HENT:     Head: Normocephalic and atraumatic.  Pulmonary:     Effort: Pulmonary effort is normal.  Neurological:     General: No focal deficit present.     Mental Status: He is alert. Mental status is at baseline.      Review of Systems  Constitutional: Negative.   Respiratory: Negative.    Cardiovascular: Negative.   Gastrointestinal: Negative.   Genitourinary: Negative.       Blood pressure (!) 152/92, pulse 92, temperature 98.3 F (36.8 C), temperature source Oral, resp. rate 16, height 5\' 5"  (1.651 m), weight 92.5 kg, SpO2 100 %. Body mass index is 33.95 kg/m.   Assets  Assets:Communication Skills; Desire for Improvement; Housing; Leisure Time; Physical Health; Resilience; Social Support  Demographic Factors:  Male, Adolescent or young adult, Low socioeconomic status, Living alone, and Unemployed  Loss Factors: Decline in physical health  Historical Factors: Family history of mental illness or substance abuse and Impulsivity  Risk Reduction Factors:   Positive coping skills or problem solving skills  Continued Clinical Symptoms:  More  than one psychiatric diagnosis  Cognitive Features That Contribute To Risk:  None     Suicide Risk:  Acute Risk:  Minimal: No identifiable suicidal ideation.  Patients presenting with no risk factors but with morbid ruminations; may be classified as minimal risk based on the severity of the depressive symptoms  Chronic Risk:  Minimal: No identifiable suicidal ideation.  Patients presenting with no risk factors but with morbid ruminations; may be classified as minimal risk based on the severity of the depressive symptoms   Follow-up Information     [facility]. Call.   Why: Please call your CST to inform them when you discharge from the hospital. Your team will visit you within 7 days of discharge from the hospital. Contact information: [address] [place]  [place], [place] [address]        Phone: [phone]        [facility]. Go to.   Specialty: Behavioral Health Why: Please present for New Patient [facility] Wednesday-Friday at 8:00am.   Please arrive no later than 7:45am -- this clinic operates on first-come, first-served basis. Contact information: [address] [place] [address] [phone]                Plan Of Care/Follow-up recommendations:  Activity: as tolerated   Diet: heart healthy   # It is recommended to the patient to continue psychiatric medications as prescribed, after discharge from the hospital.     # It is recommended to the patient to follow up with your outpatient psychiatric provider -instructions on appointment date, time, and address (location) are provided to you in discharge paperwork   # Follow-up with outpatient primary care doctor and other specialists -for management of chronic medical disease, including: hyperlipidemia, hypertriglyceridemia   # Testing: Follow-up with outpatient provider for abnormal lab results: elevated cholesterol, elevated LDL   # It was discussed with the patient, the impact of alcohol, drugs, tobacco have been there overall psychiatric and  medical wellbeing, and total abstinence from substance use was recommended to the patient.   # Prescriptions provided or sent directly to preferred pharmacy at discharge. Patient agreeable to plan. Given opportunity to ask questions. Appears to feel comfortable with discharge.    # In the event of worsening symptoms, the patient is instructed to call the crisis hotline, 911, and or go to the nearest ED for appropriate evaluation and treatment of symptoms. To follow-up with primary care provider for other medical issues, concerns and or health care needs  Augusto Gamble, MD 10/10/2022, 10:19 AM

## 2022-10-10 NOTE — Progress Notes (Signed)
  [facility] Adult Case Management Discharge Plan :  Will you be returning to the same living situation after discharge:  Yes,  lives alone At discharge, do you have transportation home?: Yes,  will take an [employer] or catch the bus, states he has the resources to pay for either Do you have the ability to pay for your medications: Yes,  Medicaid  Release of information consent forms completed and emailed to Medical Records, then turned in to Medical Records by CSW.   Patient to Follow up at:  Follow-up Information     [facility]. Call.   Why: Please call your CST to inform them when you discharge from the hospital. Your team will visit you within 7 days of discharge from the hospital. Contact information: [address], [place] [address]        Phone: [phone]        [facility]. Go to.   Specialty: Behavioral Health Why: Please present for New Patient [facility] Wednesday-Friday at 8:00am.   Please arrive no later than 7:45am -- this clinic operates on first-come, first-served basis. Contact information: [place] [phone]              Other options for follow-up include:  [facility] of the [place] [address]  Tel:  [place]  [place] [address]  Tel:  [phone]    Next level of care provider has access to [place] and Suicide Prevention discussed: Yes,  with [facility] Coordinator     Has patient been referred to the Quitline?: Patient refused referral  Patient has been referred for addiction treatment: Yes  Also was provided with a list of [place] meetings.Maretta Los, LCSW 10/10/2022, 9:05 AM

## 2022-10-10 NOTE — [facility] Group Notes (Signed)
LCSW Group Therapy Note  10/10/2022      Type of Therapy and Topic:  Group Therapy: Gratitude  Participation Level:  Active   Description of Group:   In this group, patients shared and discussed the importance of acknowledging the elements in their lives for which they are grateful and how this can positively impact their mood.  The group discussed how bringing the positive elements of their lives to the forefront of their minds can help with recovery from any illness, physical or mental.  An exercise was done as a group in which a list was made of gratitude items in order to encourage participants to consider other potential positives in their lives.  Therapeutic Goals: Patients will identify one or more item for which they are grateful in each of 6 categories:  people, experience, thing, place, skill, and other. Patients will discuss how it is possible to seek out gratitude in even bad situations. Patients will explore other possible items of gratitude that they could remember.   Summary of Patient Progress:  The patient shared that he was most grateful for his life.  When he was asked what skills he has that he is grateful for, he could not think of any, so the group provided him with their observations, which seemed to brighten his mood.   Therapeutic Modalities:   Solution-Focused Therapy Activity  Berlin Hun Grossman-Orr, LCSW .

## 2022-10-10 NOTE — Discharge Summary (Signed)
Physician Discharge Summary Note Patient:  [name] is an 26 y.o., male MRN:  [id number] DOB:  [birth year] Patient phone:  [phone] (home)  Patient address:   [address] [place] [address],  Total Time spent with patient: 1 hour  Date of Admission:  10/05/2022 Date of Discharge: 10/10/2022  Reason for Admission:  worsening depression, suicidal ideation  Principal Problem: MDD (major depressive disorder), recurrent severe, without psychosis (HCC) Discharge Diagnoses: Principal Problem:   MDD (major depressive disorder), recurrent severe, without psychosis (HCC) Active Problems:   PTSD (post-traumatic stress disorder)   Alcohol use disorder   Past Psychiatric History: see HPI  Past Medical History:  Past Medical History:  Diagnosis Date   ADHD (attention deficit hyperactivity disorder)    Anxiety    Asthma    Constipation    Deliberate self-cutting    Depression    IBS (irritable bowel syndrome)    Irritable bowel syndrome    Post traumatic stress disorder (PTSD)    Suicide attempt (HCC)    Vision abnormalities    Pt states he wears glasses   History reviewed. No pertinent surgical history. Family History:  Family History  Problem Relation Age of Onset   ADD / ADHD Brother    Hirschsprung's disease Neg Hx    Family Psychiatric  History: see HPI Social History:  Social History   Substance and Sexual Activity  Alcohol Use Not Currently   Comment: Pt states he drinks on occassion     Social History   Substance and Sexual Activity  Drug Use No   Types: Oxycodone   Comment: last used in 7th grade when he "tried it"/used oxycodone last 2015    Social History   Socioeconomic History   Marital status: Single    Spouse name: Not on file   Number of children: Not on file   Years of education: Not on file   Highest education level: Not on file  Occupational History   Not on file  Tobacco Use   Smoking status: Every Day     Packs/day: 1.50    Types: Cigarettes, Cigars   Smokeless tobacco: Never  Vaping Use   Vaping Use: Never used  Substance and Sexual Activity   Alcohol use: Not Currently    Comment: Pt states he drinks on occassion   Drug use: No    Types: Oxycodone    Comment: last used in 7th grade when he "tried it"/used oxycodone last 2015   Sexual activity: Never  Other Topics Concern   Not on file  Social History Narrative   ** Merged History Encounter **       9th grade         Social Determinants of Health   Financial Resource Strain: Not on file  Food Insecurity: No Food Insecurity (10/05/2022)   Hunger Vital Sign    Worried About Running Out of Food in the Last Year: Never true    Ran Out of Food in the Last Year: Never true  Transportation Needs: No Transportation Needs (10/05/2022)   PRAPARE - Administrator, Civil Service (Medical): No    Lack of Transportation (Non-Medical): No  Physical Activity: Not on file  Stress: Not on file  Social Connections: Not on file    Hospital Course:   During the patient's hospitalization, patient had extensive initial psychiatric evaluation, and follow-up psychiatric evaluations every day.  Psychiatric diagnoses provided upon initial assessment per above.  The following medications  were managed: Scheduled  gabapentin  100 mg Oral TID   multivitamin with minerals  1 tablet Oral Daily   nicotine  14 mg Transdermal Daily   thiamine  100 mg Oral Daily   venlafaxine XR  75 mg Oral Q breakfast   PRN acetaminophen, alum & mag hydroxide-simeth, magnesium hydroxide, traZODone  During the hospitalization, patient had the following lab / imaging / testing abnormalities which require further evaluation / management / treatment: elevated cholesterol, elevated LDL  Patient's care was discussed during the interdisciplinary team meeting every day during the hospitalization.  The patient denies having side effects to prescribed  psychiatric medication.  Patient is a 26 year old male with a psychiatric history of MDD, AD, PTSD, borderline personality disorder, substance abuse (in addition to reported history of ADHD and documented history of ODD in the chart), who was [facility] for evaluation of worsening depression and suicidal thoughts.  Gradually, patient started adjusting to milieu. The patient was evaluated each day by a clinical provider to ascertain response to treatment. Improvement was noted by the patient's report of decreasing symptoms, improved sleep and appetite, affect, medication tolerance, behavior, and participation in unit programming.  Patient was asked each day to complete a self inventory noting mood, mental status, pain, new symptoms, anxiety and concerns.    Symptoms were reported as significantly decreased or resolved completely by discharge.   On day of discharge, the patient reports that their mood is stable. The patient denied having suicidal thoughts for more than 48 hours prior to discharge.  Patient denies having homicidal thoughts.  Patient denies having auditory hallucinations.  Patient denies any visual hallucinations or other symptoms of psychosis. The patient was motivated to continue taking medication with a goal of continued improvement in mental health.   The patient reports their target psychiatric symptoms of MDD (major depressive disorder), recurrent severe, without psychosis (HCC) responded well to the psychiatric medications, and the patient reports overall benefit other psychiatric hospitalization. Supportive psychotherapy was provided to the patient. The patient also participated in regular group therapy while hospitalized. Coping skills, problem solving as well as relaxation therapies were also part of the unit programming.  Labs were reviewed with the patient, and abnormal results were discussed with the patient.  The patient is able to  verbalize their individual safety plan to this provider.  # It is recommended to the patient to continue psychiatric medications as prescribed, after discharge from the hospital.    # It is recommended to the patient to follow up with your outpatient psychiatric provider and PCP.  # It was discussed with the patient, the impact of alcohol, drugs, tobacco have been there overall psychiatric and medical wellbeing, and total abstinence from substance use was recommended to the patient.  # Prescriptions provided or sent directly to preferred pharmacy at discharge. Patient agreeable to plan. Given opportunity to ask questions. Appears to feel comfortable with discharge.    # In the event of worsening symptoms, the patient is instructed to call the crisis hotline, 911 and or go to the nearest ED for appropriate evaluation and treatment of symptoms. To follow-up with primary care provider for other medical issues, concerns and or health care needs  # Patient was discharged home with a plan to follow up as noted below.  Physical Findings: AIMS:  , ,  ,  ,    CIWA:  CIWA-Ar Total: 0 COWS:     Mental Status Exam:  Appearance  and Grooming: Patient is casually dressed in t-shirt and joggers . The patient has no noticeable scent or odor.  Behavior: The patient appears in no acute distress, and during the interview, was calm, focused, required minimal redirection, and behaving appropriately to scenario; he was able to follow commands and compliant to requests and made good eye contact.  Attitude: Patient was cooperative and open during the interview.  Motor activity: The patient's movement speed was normal; his gait was normal. There was no notable abnormal facial movements and no notable abnormal extremity movements.  Speech: The patient's speech was clear, fluent, with good articulation, and with appropriately placed inflections. The volume of his speech was normal and normal in quantity. The  rate was normal with a normal rhythm. Responses were normal in latency. There were no abnormal patterns in speech.  Mood: "Good"  Affect: Patient's affect is euthymic with broad range and even fluctuations; his affect is congruent with his stated mood. -------------------------------------------------------------------------------------------------------------------------  Thought Content The patient experiences no hallucinations. The patient describes no delusional thoughts; he denies thought insertion, denies thought withdrawal, denies thought interruption, and denies thought broadcasting.  Patient denies active suicidal intent and denies passive suicidal ideation; he denies homicidal intent.  Thought Process The patient's thought process is linear and goal-directed.  Insight The patient demonstrates good insight, as evidenced by awareness of mental health conditions, substance use disorders, and mental health decompensation triggers.  Judgement The patient demonstrates good judgement, as evidenced by help-seeking behavior, such as voluntary admission to [facility], adhering to medication regimen, actively participating in individual therapy, and actively participating in group therapy.   Sleep  Sleep:No data recorded   No data recorded  Physical Exam Vitals and nursing note reviewed.  Constitutional:      Appearance: Normal appearance.  HENT:     Head: Normocephalic and atraumatic.  Pulmonary:     Effort: Pulmonary effort is normal.  Neurological:     General: No focal deficit present.     Mental Status: He is alert. Mental status is at baseline.    Review of Systems  Constitutional: Negative.   Respiratory: Negative.    Cardiovascular: Negative.   Gastrointestinal: Negative.   Genitourinary: Negative.     Blood pressure (!) 152/92, pulse 92, temperature 98.3 F (36.8 C), temperature source Oral, resp. rate 16, height 5\' 5"  (1.651 m), weight 92.5 kg, SpO2 100 %. Body  mass index is 33.95 kg/m.  Assets  Assets:Communication Skills; Desire for Improvement; Housing; Leisure Time; Physical Health; Resilience; Social Support   Social History   Tobacco Use  Smoking Status Every Day   Packs/day: 1.50   Types: Cigarettes, Cigars  Smokeless Tobacco Never   Tobacco Cessation:  A prescription for an FDA-approved tobacco cessation medication provided at discharge   Blood Alcohol level:  Lab Results  Component Value Date   ETH 242 (H) 10/04/2022   ETH <10 49/17/9150    Metabolic Disorder Labs:  Lab Results  Component Value Date   HGBA1C 4.8 10/04/2022   MPG 91.06 10/04/2022   MPG 93.93 10/06/2021   Lab Results  Component Value Date   PROLACTIN 6.8 11/16/2015   Lab Results  Component Value Date   CHOL 226 (H) 10/04/2022   TRIG 114 10/04/2022   HDL 68 10/04/2022   CHOLHDL 3.3 10/04/2022   VLDL 23 10/04/2022   LDLCALC 135 (H) 10/04/2022   [place] 89 10/06/2021    Discharge destination:  Home  Is patient on multiple antipsychotic therapies at discharge:  No   Has Patient had three or more failed trials of antipsychotic monotherapy by history:  No  Recommended Plan for Multiple Antipsychotic Therapies: NA  Discharge Instructions     Activity as tolerated - No restrictions   Complete by: As directed    Diet - low sodium heart healthy   Complete by: As directed       Allergies as of 10/10/2022   No Known Allergies      Medication List     STOP taking these medications    acetaminophen 325 MG tablet Commonly known as: TYLENOL       TAKE these medications      Indication  gabapentin 100 MG capsule Commonly known as: NEURONTIN Take 1 capsule (100 mg total) by mouth 3 (three) times daily.  Indication: Abuse or Misuse of Alcohol, Generalized Anxiety Disorder   nicotine 14 mg/24hr patch Commonly known as: NICODERM CQ - dosed in mg/24 hours Place 1 patch (14 mg total) onto the skin daily.  Indication: Nicotine  Addiction   traZODone 50 MG tablet Commonly known as: DESYREL Take 1 tablet (50 mg total) by mouth at bedtime as needed and may repeat dose one time if needed for sleep.  Indication: insomnia in patients with MDD   venlafaxine XR 75 MG 24 hr capsule Commonly known as: EFFEXOR-XR Take 1 capsule (75 mg total) by mouth daily with breakfast.  Indication: Generalized Anxiety Disorder, Major Depressive Disorder, Posttraumatic Stress Disorder        Follow-up Information     [facility]. Call.   Why: Please call your CST to inform them when you discharge from the hospital. Your team will visit you within 7 days of discharge from the hospital. Contact information: [address] [place]  [place], [place] [address]        Phone: [phone]        [facility]. Go to.   Specialty: Behavioral Health Why: Please present for New Patient [facility] Wednesday-Friday at 8:00am.   Please arrive no later than 7:45am -- this clinic operates on first-come, first-served basis. Contact information: [address] [place] [address] [phone]                Discharge recommendations:  Activity: as tolerated  Diet: heart healthy  # It is recommended to the patient to continue psychiatric medications as prescribed, after discharge from the hospital.     # It is recommended to the patient to follow up with your outpatient psychiatric provider -instructions on appointment date, time, and address (location) are provided to you in discharge paperwork  # Follow-up with outpatient primary care doctor and other specialists -for management of chronic medical disease, including: hyperlipidemia, hypertriglyceridemia  # Testing: Follow-up with outpatient provider for abnormal lab results: elevated cholesterol, elevated LDL   # It was discussed with the patient, the impact of alcohol, drugs, tobacco have been there overall psychiatric and medical  wellbeing, and total abstinence from substance use was recommended to the patient.   # Prescriptions provided or sent directly to preferred pharmacy at discharge. Patient agreeable to plan. Given opportunity to ask questions. Appears to feel comfortable with discharge.    # In the event of worsening symptoms, the patient is instructed to call the crisis hotline, 911, and or go to the nearest ED for appropriate evaluation and treatment of symptoms. To follow-up with primary care provider for other medical issues, concerns and or health care needs  Patient agrees with D/C instructions and plan.   Total Time Spent in Direct Patient Care:  I personally spent 60 minutes on the unit in direct patient care. The direct patient care time included face-to-face time with the patient, reviewing the patient's chart, communicating with other professionals, and coordinating care. Greater than 50% of this time was spent in counseling or coordinating care with the patient regarding goals of hospitalization, psycho-education, and discharge planning needs.   I discussed my assessment, planned testing and intervention for the patient with Dr. Lenon Ahmadi who agrees with my formulated course of action.  Signed: Augusto Gamble, MD, PGY-1 10/10/2022, 10:18 AM

## 2022-10-10 NOTE — [facility] Group Notes (Signed)
[facility] Group Notes:  (Nursing/MHT/Case Management/Adjunct)  Date:  10/10/2022  Time:  9:45 AM  Type of Therapy:   Discussion  Participation Level:  Active  Participation Quality:  Appropriate  Affect:  Appropriate  Cognitive:  Appropriate  Insight:  Appropriate  Engagement in Group:  Engaged and Supportive  Modes of Intervention:  Discussion, Orientation, and Socialization  Summary of Progress/Problems:  [name] 10/10/2022, 9:45 AM 

## 2022-10-10 NOTE — Discharge Instructions (Addendum)
Dear [name],  It was a pleasure to take care of you during your stay at [facility] where you were treated for your MDD (major depressive disorder), recurrent severe, without psychosis ([place]).  While you were here, you were:  observed and cared for by our nurses and nursing assistants  treated with medications by your psychiatrists  provided individual and group therapy by therapists  provided resources by our social workers and case managers  Please review the medication list provided to you at discharge and stop, start taking, or continue taking the medications listed there.  You should also follow-up with your primary care doctor, or start seeing one if you don't have one yet. Here are some scheduled follow-ups for you:  Follow-up Information     [facility]. Call.   Why: Please call your Community Support Team to inform them when you discharge from the hospital. Your team will visit you within 7 days of discharge from the hospital. Contact information: [address] [place], [place] [address]        Phone: [phone]        [facility]. Go to.   Specialty: Behavioral Health Why: Please present for [facility] Wednesday-Friday at 8:00am.  Please arrive no later than 7:45am -- this clinic operates on first-come, first-served basis.  Contact information: [place] [phone]               Other options for follow-up include:  [facility] [place] [address]  Tel:  [address]  [address] [place] [address]  Tel:  [phone]      Take care!  Camelia Phenes, MD [facility] (Physician) 10/10/2022 8:13 AM

## 2024-02-14 ENCOUNTER — Ambulatory Visit (HOSPITAL_COMMUNITY)
Admission: EM | Admit: 2024-02-14 | Discharge: 2024-02-14 | Disposition: A | Discharge status: Home / Self Care | Payer: MEDICAID | Attending: Physician Assistant | Admitting: Physician Assistant

## 2024-02-14 ENCOUNTER — Ambulatory Visit (INDEPENDENT_AMBULATORY_CARE_PROVIDER_SITE_OTHER): Payer: MEDICAID

## 2024-02-14 ENCOUNTER — Encounter (HOSPITAL_COMMUNITY): Payer: Self-pay

## 2024-02-14 ENCOUNTER — Other Ambulatory Visit: Payer: Self-pay

## 2024-02-14 DIAGNOSIS — R0789 Other chest pain: Secondary | ICD-10-CM | POA: Diagnosis not present

## 2024-02-14 DIAGNOSIS — M79601 Pain in right arm: Secondary | ICD-10-CM

## 2024-02-14 DIAGNOSIS — M542 Cervicalgia: Secondary | ICD-10-CM | POA: Diagnosis not present

## 2024-02-14 MED ORDER — KETOROLAC TROMETHAMINE 30 MG/ML IJ SOLN
30.0000 mg | Freq: Once | INTRAMUSCULAR | Status: AC
  Administered 2024-02-14: 30 mg via INTRAMUSCULAR

## 2024-02-14 MED ORDER — KETOROLAC TROMETHAMINE 30 MG/ML IJ SOLN
INTRAMUSCULAR | Status: AC
  Filled 2024-02-14: qty 1

## 2024-02-14 NOTE — ED Provider Notes (Signed)
[facility]    CSN: [id number] Arrival date & time: 02/14/24  0944      History   Chief Complaint Chief Complaint  Patient presents with   Arm Pain    HPI [name] is a 28 y.o. male.   HPI  He reports he has been having right arm pain for the past 5-6 months He states today the pain became worse and is also having some burning sensation in center of chest He states his right arm has started to hurt in the right axilla and radiates to middle fingers  He reports pain is shooting in nature or he will have numbness and tingling in whole hand  He reports keeping arm in neutral position can sometimes reduce pain but today it is worse  Interventions: tylenol but this doesn't seem to help much   He reports left sided chest pain that seems to worsen with increased right arm pain He reports chest pain gets worse with exertion and feels like burning and pressure  He reports chest pain sometimes hurts in similar fashion with palpation but not usually   He denies injuries to right arm or chest He does have a job with some repetitive motion requirements He reports previous hx of neck conditions - states if he sleeps wrong on his neck he will have more severe arm pain     Past Medical History:  Diagnosis Date   ADHD (attention deficit hyperactivity disorder)    Anxiety    Asthma    Constipation    Deliberate self-cutting    Depression    IBS (irritable bowel syndrome)    Irritable bowel syndrome    Post traumatic stress disorder (PTSD)    Suicide attempt (HCC)    Vision abnormalities    Pt states he wears glasses    Patient Active Problem List   Diagnosis Date Noted   Alcohol use disorder 10/06/2022   Vitamin D deficiency 10/11/2021   Nicotine use disorder 10/06/2021   Severe episode of recurrent major depressive disorder, without psychotic features (HCC)    Major depression 04/06/2016   MDD (major depressive disorder), recurrent episode, severe  (HCC) 11/14/2015   Substance abuse (HCC) 09/27/2015   MDD (major depressive disorder), recurrent severe, without psychosis (HCC) 02/01/2015   PTSD (post-traumatic stress disorder) 11/28/2013   ADHD (attention deficit hyperactivity disorder), combined type 10/16/2013   Oppositional defiant disorder 10/16/2013   Chronic constipation     History reviewed. No pertinent surgical history.     Home Medications    Prior to Admission medications   Medication Sig Start Date End Date Taking? Authorizing Provider  gabapentin (NEURONTIN) 100 MG capsule Take 1 capsule (100 mg total) by mouth 3 (three) times daily. Patient not taking: Reported on 02/14/2024 10/10/22 11/09/22  Augusto Gamble, MD  traZODone (DESYREL) 50 MG tablet Take 1 tablet (50 mg total) by mouth at bedtime as needed and may repeat dose one time if needed for sleep. Patient not taking: Reported on 02/14/2024 10/10/22 11/09/22  Augusto Gamble, MD  venlafaxine XR (EFFEXOR-XR) 75 MG 24 hr capsule Take 1 capsule (75 mg total) by mouth daily with breakfast. 10/10/22 11/09/22  Augusto Gamble, MD    Family History Family History  Problem Relation Age of Onset   ADD / ADHD Brother    Hirschsprung's disease Neg Hx     Social History Social History   Tobacco Use   Smoking status: Some Days    Current packs/day: 1.50  Types: Cigarettes, Cigars   Smokeless tobacco: Never  Vaping Use   Vaping status: Never Used  Substance Use Topics   Alcohol use: Yes    Comment: Pt states he drinks on occassion   Drug use: Yes    Types: Oxycodone, Marijuana    Comment: last used in 7th grade when he "tried it"/used oxycodone last 2015     Allergies   Patient has no known allergies.   Review of Systems Review of Systems  Cardiovascular:  Positive for chest pain.  Musculoskeletal:  Positive for arthralgias.     Physical Exam Triage Vital Signs ED Triage Vitals  Encounter Vitals Group     BP 02/14/24 1059 (!) 148/92     Systolic BP  Percentile --      Diastolic BP Percentile --      Pulse Rate 02/14/24 1059 78     Resp 02/14/24 1059 18     Temp 02/14/24 1059 98.4 F (36.9 C)     Temp Source 02/14/24 1059 Oral     SpO2 02/14/24 1059 97 %     Weight --      Height --      Head Circumference --      Peak Flow --      Pain Score 02/14/24 1100 5     Pain Loc --      Pain Education --      Exclude from Growth Chart --    No data found.  Updated Vital Signs BP (!) 148/92 (BP Location: Left Arm)   Pulse 78   Temp 98.4 F (36.9 C) (Oral)   Resp 18   SpO2 97%   Visual Acuity Right Eye Distance:   Left Eye Distance:   Bilateral Distance:    Right Eye Near:   Left Eye Near:    Bilateral Near:     Physical Exam Vitals reviewed.  Constitutional:      General: He is awake.     Appearance: Normal appearance. He is well-developed and well-groomed.  HENT:     Head: Normocephalic and atraumatic.  Cardiovascular:     Rate and Rhythm: Normal rate and regular rhythm.  Pulmonary:     Effort: Pulmonary effort is normal.  Musculoskeletal:     Right shoulder: Normal. No swelling, deformity or tenderness. Normal range of motion. Normal strength.     Left shoulder: Normal.     Right elbow: Normal. Normal range of motion. No tenderness.     Right wrist: No swelling, deformity or tenderness. Normal range of motion.     Right hand: No swelling or tenderness. Normal range of motion. Normal strength. Normal pulse.     Left hand: Normal.     Cervical back: Normal range of motion. No swelling, edema, deformity, erythema, rigidity, torticollis or tenderness. Pain with movement present. Normal range of motion.  Neurological:     General: No focal deficit present.     Mental Status: He is alert and oriented to person, place, and time.  Psychiatric:        Mood and Affect: Mood normal.        Behavior: Behavior normal. Behavior is cooperative.        Thought Content: Thought content normal.        Judgment: Judgment  normal.      UC Treatments / Results  Labs (all labs ordered are listed, but only abnormal results are displayed) Labs Reviewed - No data to display  EKG  Radiology DG Shoulder Right Result Date: 02/14/2024 CLINICAL DATA:  Right arm pain radiating down to the right hand. EXAM: RIGHT SHOULDER - 2+ VIEW COMPARISON:  None Available. FINDINGS: Normal bone mineralization. Normal alignment. No acute fracture or dislocation. The visualized portions of the right lung are unremarkable. IMPRESSION: Normal right shoulder radiographs. Electronically Signed   By: Neita Garnet M.D.   On: 02/14/2024 15:24   DG Cervical Spine Complete Result Date: 02/14/2024 CLINICAL DATA:  Neck pain. Right arm pain radiating down to hand. Headaches. 5-6 months. EXAM: CERVICAL SPINE - COMPLETE 4+ VIEW COMPARISON:  None available FINDINGS: There is mild straightening of the normal cervical lordosis without sagittal spondylolisthesis. The lateral masses of C1 are symmetrically aligned with the dens on open mouth odontoid view. Uncovertebral and facet joint hypertrophy contribute to mild left C4-5 neuroforaminal narrowing on oblique view. Mild-to-moderate anterior C4-5 endplate osteophytes. Minimal C4-5 disc space narrowing. No prevertebral soft tissue swelling. The lung apices are clear. IMPRESSION: 1. Mild C4-5 degenerative disc and endplate changes. 2. Mild left C4-5 neuroforaminal narrowing. Electronically Signed   By: Neita Garnet M.D.   On: 02/14/2024 15:23    Procedures ED EKG  Date/Time: 02/14/2024 12:30 PM  Performed by: Providence Crosby, PA-C Authorized by: Providence Crosby, PA-C   Previous ECG:    Previous ECG:  Compared to current   Similarity:  No change   Comparison ECG info:  10/06/22 Interpretation:    Interpretation: normal   Rate:    ECG rate:  72   ECG rate assessment: normal   Rhythm:    Rhythm: sinus rhythm   ST segments:    ST segments:  Normal T waves:    T waves: normal    (including  critical care time)  Medications Ordered in UC Medications  ketorolac (TORADOL) 30 MG/ML injection 30 mg (30 mg Intramuscular Given 02/14/24 1242)    Initial Impression / Assessment and Plan / UC Course  I have reviewed the triage vital signs and the nursing notes.  Pertinent labs & imaging results that were available during my care of the patient were reviewed by me and considered in my medical decision making (see chart for details).      Final Clinical Impressions(s) / UC Diagnoses   Final diagnoses:  Right arm pain  Other chest pain  Neck pain   Chronic, ongoing concern Patient reports he has had numbness and pain in his right arm from the shoulder to fingers for several months that is recurrent and persistent. ROM at shoulder, elbow, wrist and fingers is intact and symmetrical. Strength is 5/5 with regards to shoulder, wrist flexion and extension, finger abduction, grip strength-bilaterally.  Cervical imaging demonstrates mild C4-5 degenerative disc and endplate changes as well as neuroforaminal narrowing.  I suspect that his right arm pain is likely due to cervical degenerative disease. I recommend he follows up with orthopedics or neurosurgery for further evaluation and management.  He reports that pain was significantly improved with Toradol 30 mg injection in clinic.  Recommend that he continues with Tylenol and ibuprofen as needed for further pain management.  Patient did express some concerns for left-sided chest pain that gets worse with exertion.  EKG was overall normal and consistent with previous EKG results.  Results were reviewed with him during his appointment.   Attempted to call patient with results of imaging but this went to voicemail. Will route to call back nurse for further follow up.  Discharge Instructions      We are still waiting on the results of your x-rays.  We will keep you updated once those are back.  At this time I suspect that your right  arm pain and numbness is likely secondary to a pinched nerve or other process in your neck.  At this time you can continue to use Tylenol and ibuprofen as needed for pain management.  I would recommend going to orthopedics for further evaluation and potential management as you may benefit from physical therapy or even surgical procedure if this continues.  If you start to develop increased numbness, pain, chest pain and pressure and that is not going away, trouble breathing, sweating, weakness on one side of your body please go to the emergency room for further evaluation and management     ED Prescriptions   None    PDMP not reviewed this encounter.   Providence Crosby, PA-C 02/14/24 1631

## 2024-02-14 NOTE — Discharge Instructions (Addendum)
We are still waiting on the results of your x-rays.  We will keep you updated once those are back.  At this time I suspect that your right arm pain and numbness is likely secondary to a pinched nerve or other process in your neck.  At this time you can continue to use Tylenol and ibuprofen as needed for pain management.  I would recommend going to orthopedics for further evaluation and potential management as you may benefit from physical therapy or even surgical procedure if this continues.  If you start to develop increased numbness, pain, chest pain and pressure and that is not going away, trouble breathing, sweating, weakness on one side of your body please go to the emergency room for further evaluation and management

## 2024-02-14 NOTE — Progress Notes (Signed)
Right shoulder imaging was normal.  No acute fracture or dislocation noted.  I suspect his symptoms are likely coming from his neck

## 2024-02-14 NOTE — ED Triage Notes (Signed)
Pt c/o rt arm pain radiating down to hand, headaches, neck stiffness/pain, and chest tightness/burning x5-6 months. Denies taking any meds. States pain worse today, thinking slept wrong.

## 2024-02-14 NOTE — Progress Notes (Signed)
Cervical x-rays show some mild C4-5 degenerative disc disease as well as some neuroforaminal narrowing.  This could be the cause of his right arm pain and suspected radiculopathy.  I do recommend following up with orthopedics or neurosurgery for further evaluation and management.  He can continue to use Tylenol and ibuprofen as needed for pain management but more definitive resolution will likely require specialist intervention.

## 2024-05-02 ENCOUNTER — Encounter (HOSPITAL_COMMUNITY): Payer: Self-pay

## 2024-05-02 ENCOUNTER — Emergency Department (HOSPITAL_COMMUNITY)
Admission: EM | Admit: 2024-05-02 | Discharge: 2024-05-02 | Disposition: A | Discharge status: Home / Self Care | Payer: MEDICAID | Attending: Emergency Medicine | Admitting: Emergency Medicine

## 2024-05-02 ENCOUNTER — Emergency Department (HOSPITAL_COMMUNITY): Payer: MEDICAID

## 2024-05-02 ENCOUNTER — Other Ambulatory Visit: Payer: Self-pay

## 2024-05-02 DIAGNOSIS — X501XXA Overexertion from prolonged static or awkward postures, initial encounter: Secondary | ICD-10-CM | POA: Insufficient documentation

## 2024-05-02 DIAGNOSIS — M25571 Pain in right ankle and joints of right foot: Secondary | ICD-10-CM | POA: Diagnosis present

## 2024-05-02 DIAGNOSIS — S93501A Unspecified sprain of right great toe, initial encounter: Secondary | ICD-10-CM | POA: Diagnosis not present

## 2024-05-02 DIAGNOSIS — S93401A Sprain of unspecified ligament of right ankle, initial encounter: Secondary | ICD-10-CM

## 2024-05-02 MED ORDER — IBUPROFEN 400 MG PO TABS: 600.0000 mg | ORAL_TABLET | Freq: Once | ORAL | Status: DC

## 2024-05-02 MED ORDER — MORPHINE SULFATE (PF) 4 MG/ML IV SOLN
4.0000 mg | Freq: Once | INTRAVENOUS | Status: AC
  Administered 2024-05-02: 4 mg via INTRAVENOUS
  Filled 2024-05-02: qty 1

## 2024-05-02 MED ORDER — OXYCODONE-ACETAMINOPHEN 5-325 MG PO TABS: 1.0000 | ORAL_TABLET | Freq: Four times a day (QID) | ORAL | 0 refills | Status: AC | PRN

## 2024-05-02 MED ORDER — OXYCODONE-ACETAMINOPHEN 5-325 MG PO TABS: 1.0000 | ORAL_TABLET | Freq: Once | ORAL | Status: DC

## 2024-05-02 MED ORDER — ONDANSETRON HCL 4 MG/2ML IJ SOLN
4.0000 mg | Freq: Once | INTRAMUSCULAR | Status: AC
  Administered 2024-05-02: 4 mg via INTRAVENOUS
  Filled 2024-05-02: qty 2

## 2024-05-02 MED ORDER — MORPHINE SULFATE (PF) 4 MG/ML IV SOLN: 4.0000 mg | Freq: Once | INTRAVENOUS | Status: DC

## 2024-05-02 MED ORDER — IBUPROFEN 600 MG PO TABS: 600.0000 mg | ORAL_TABLET | Freq: Four times a day (QID) | ORAL | 0 refills | Status: AC | PRN

## 2024-05-02 MED ORDER — ONDANSETRON HCL 4 MG/2ML IJ SOLN: 4.0000 mg | Freq: Once | INTRAMUSCULAR | Status: DC

## 2024-05-02 NOTE — ED Notes (Signed)
Ortho has been called.  

## 2024-05-02 NOTE — Progress Notes (Signed)
 Orthopedic Tech Progress Note Patient Details:  [name] 1996/01/26 [id number]  Ortho Devices Type of Ortho Device: Ankle Air splint, Crutches Ortho Device/Splint Location: RLE Ortho Device/Splint Interventions: Application   Post Interventions Patient Tolerated: Well  Meylin Stenzel E Lemont Sitzmann 05/02/2024, 11:14 AM

## 2024-05-02 NOTE — ED Triage Notes (Signed)
 Tripped and fell last night around 1130pm.  Twisting right ankle.  Obvious swelling.  But no deformity noted. Hr 130

## 2024-05-02 NOTE — ED Provider Notes (Signed)
 [place] [facility] DEPART[facility] AT [facility] Provider Note   CSN: [id number] Arrival date & time: 05/02/24  0845     History  Chief Complaint  Patient presents with   Ankle Pain    [name] is a 28 y.o. male.  Pt is a 28 yo male with pmhx significant for IBS, ptsd, depression, anxiety, and adhd.  He said he stepped in a hold last night around 2300 and twisted his ankle.  He was able to walk, but it hurt.  It hurts more today, so he came in.  No other injuries.       Home Medications Prior to Admission medications   Medication Sig Start Date End Date Taking? Authorizing Provider  ibuprofen  (ADVIL ) 600 MG tablet Take 1 tablet (600 mg total) by mouth every 6 (six) hours as needed. 05/02/24  Yes Sueellen Emery, MD  oxyCODONE-acetaminophen  (PERCOCET/ROXICET) 5-325 MG tablet Take 1 tablet by mouth every 6 (six) hours as needed for severe pain (pain score 7-10). 05/02/24  Yes Sueellen Emery, MD  gabapentin  (NEURONTIN ) 100 MG capsule Take 1 capsule (100 mg total) by mouth 3 (three) times daily. Patient not taking: Reported on 02/14/2024 10/10/22 11/09/22  Dru Georges, MD  traZODone  (DESYREL ) 50 MG tablet Take 1 tablet (50 mg total) by mouth at bedtime as needed and may repeat dose one time if needed for sleep. Patient not taking: Reported on 02/14/2024 10/10/22 11/09/22  Dru Georges, MD  venlafaxine  XR (EFFEXOR -XR) 75 MG 24 hr capsule Take 1 capsule (75 mg total) by mouth daily with breakfast. 10/10/22 11/09/22  Dru Georges, MD      Allergies    Patient has no known allergies.    Review of Systems   Review of Systems  Musculoskeletal:        Right ankle pain  All other systems reviewed and are negative.   Physical Exam Updated Vital Signs BP (!) 136/96 (BP Location: Left Arm)   Pulse (!) 123   Temp 98.2 F (36.8 C) (Oral)   Resp 16   Ht 5\' 5"  (1.651 m)   Wt 92.5 kg   SpO2 96%   BMI 33.95 kg/m  Physical Exam Vitals and nursing note reviewed.   Constitutional:      Appearance: Normal appearance.  HENT:     Head: Normocephalic and atraumatic.     Right Ear: External ear normal.     Left Ear: External ear normal.     Nose: Nose normal.     Mouth/Throat:     Mouth: Mucous membranes are moist.     Pharynx: Oropharynx is clear.  Eyes:     Extraocular Movements: Extraocular movements intact.     Conjunctiva/sclera: Conjunctivae normal.     Pupils: Pupils are equal, round, and reactive to light.  Cardiovascular:     Rate and Rhythm: Normal rate and regular rhythm.     Pulses: Normal pulses.     Heart sounds: Normal heart sounds.  Pulmonary:     Effort: Pulmonary effort is normal.     Breath sounds: Normal breath sounds.  Abdominal:     General: Abdomen is flat. Bowel sounds are normal.     Palpations: Abdomen is soft.  Musculoskeletal:     Cervical back: Normal range of motion and neck supple.     Right ankle: Swelling present. Tenderness present. Decreased range of motion.       Legs:  Skin:    General: Skin is warm.  Capillary Refill: Capillary refill takes less than 2 seconds.  Neurological:     General: No focal deficit present.     Mental Status: He is alert and oriented to person, place, and time.  Psychiatric:        Mood and Affect: Mood normal.        Behavior: Behavior normal.     ED Results / Procedures / Treatments   Labs (all labs ordered are listed, but only abnormal results are displayed) Labs Reviewed - No data to display  EKG None  Radiology DG Ankle Complete Right Result Date: 05/02/2024 CLINICAL DATA:  Pain after rolling ankle last night. Lateral pain and swelling. Unable to bear weight. EXAM: RIGHT ANKLE - COMPLETE 3+ VIEW COMPARISON:  None Available. FINDINGS: There is no evidence of fracture or dislocation. Ankle mortise is preserved. Mild to moderate ankle joint effusion lateral soft tissue edema. IMPRESSION: Lateral soft tissue edema and ankle joint effusion. No fracture or  dislocation. Electronically Signed   By: Chadwick Colonel M.D.   On: 05/02/2024 10:27    Procedures Procedures    Medications Ordered in ED Medications  morphine (PF) 4 MG/ML injection 4 mg (has no administration in time range)  morphine (PF) 4 MG/ML injection 4 mg (4 mg Intravenous Given 05/02/24 0956)  ondansetron  (ZOFRAN ) injection 4 mg (4 mg Intravenous Given 05/02/24 4098)    ED Course/ Medical Decision Making/ A&P                                 Medical Decision Making Amount and/or Complexity of Data Reviewed Radiology: ordered.  Risk Prescription drug management.   This patient presents to the ED for concern of r ankle pain, this involves an extensive number of treatment options, and is a complaint that carries with it a high risk of complications and morbidity.  The differential diagnosis includes fx, sprain   Co morbidities that complicate the patient evaluation  IBS, ptsd, depression, anxiety, and adhd   Additional history obtained:  Additional history obtained from epic chart review  Imaging Studies ordered:  I ordered imaging studies including r ankle  I independently visualized and interpreted imaging which showed  Lateral soft tissue edema and ankle joint effusion. No fracture or  dislocation.   I agree with the radiologist interpretation   Medicines ordered and prescription drug management:  I ordered medication including morphine/zofran   for pain  Reevaluation of the patient after these medicines showed that the patient improved I have reviewed the patients home medicines and have made adjustments as needed   Test Considered:  xr   Critical Interventions:  Pain control  Problem List / ED Course:  R ankle sprain:  pt placed in an air cast and crutches.  He is to f/u with ortho.  He is to return if worse.    Reevaluation:  After the interventions noted above, I reevaluated the patient and found that they have :improved   Social  Determinants of Health:  Lives at home   Dispostion:  After consideration of the diagnostic results and the patients response to treatment, I feel that the patent would benefit from discharge with outpatient f/u.          Final Clinical Impression(s) / ED Diagnoses Final diagnoses:  Sprain of right ankle, unspecified ligament, initial encounter    Rx / DC Orders ED Discharge Orders  Ordered    oxyCODONE-acetaminophen  (PERCOCET/ROXICET) 5-325 MG tablet  Every 6 hours PRN        05/02/24 1056    ibuprofen  (ADVIL ) 600 MG tablet  Every 6 hours PRN        05/02/24 1056              Asah Lamay, MD 05/02/24 1058

## 2024-10-24 ENCOUNTER — Other Ambulatory Visit: Payer: Self-pay

## 2024-10-24 ENCOUNTER — Encounter (HOSPITAL_COMMUNITY): Payer: Self-pay | Admitting: Emergency Medicine

## 2024-10-24 ENCOUNTER — Encounter (HOSPITAL_COMMUNITY): Payer: Self-pay

## 2024-10-24 ENCOUNTER — Emergency Department (HOSPITAL_COMMUNITY)
Admission: EM | Admit: 2024-10-24 | Discharge: 2024-10-25 | Disposition: A | Discharge status: Home / Self Care | Payer: MEDICAID | Attending: Emergency Medicine | Admitting: Emergency Medicine

## 2024-10-24 ENCOUNTER — Ambulatory Visit (HOSPITAL_COMMUNITY)
Admission: EM | Admit: 2024-10-24 | Discharge: 2024-10-24 | Disposition: A | Discharge status: Home / Self Care | Payer: MEDICAID

## 2024-10-24 ENCOUNTER — Emergency Department (HOSPITAL_COMMUNITY): Payer: MEDICAID

## 2024-10-24 DIAGNOSIS — K625 Hemorrhage of anus and rectum: Secondary | ICD-10-CM

## 2024-10-24 DIAGNOSIS — K6289 Other specified diseases of anus and rectum: Secondary | ICD-10-CM | POA: Diagnosis present

## 2024-10-24 DIAGNOSIS — R1011 Right upper quadrant pain: Secondary | ICD-10-CM

## 2024-10-24 LAB — URINALYSIS, ROUTINE W REFLEX MICROSCOPIC
Bilirubin Urine: NEGATIVE
Glucose, UA: NEGATIVE mg/dL
Hgb urine dipstick: NEGATIVE
Ketones, ur: 5 mg/dL — AB
Leukocytes,Ua: NEGATIVE
Nitrite: NEGATIVE
Protein, ur: NEGATIVE mg/dL
Specific Gravity, Urine: 1.017 (ref 1.005–1.030)
pH: 6 (ref 5.0–8.0)

## 2024-10-24 LAB — COMPREHENSIVE METABOLIC PANEL WITH GFR
ALT: 36 U/L (ref 0–44)
AST: 29 U/L (ref 15–41)
Albumin: 4.6 g/dL (ref 3.5–5.0)
Alkaline Phosphatase: 76 U/L (ref 38–126)
Anion gap: 10 (ref 5–15)
BUN: 8 mg/dL (ref 6–20)
CO2: 26 mmol/L (ref 22–32)
Calcium: 9.7 mg/dL (ref 8.9–10.3)
Chloride: 102 mmol/L (ref 98–111)
Creatinine, Ser: 0.98 mg/dL (ref 0.61–1.24)
GFR, Estimated: >60 mL/min (ref >60)
Glucose, Bld: 80 mg/dL (ref 70–99)
Potassium: 4 mmol/L (ref 3.5–5.1)
Sodium: 138 mmol/L (ref 135–145)
Total Bilirubin: 0.9 mg/dL (ref 0.0–1.2)
Total Protein: 7.7 g/dL (ref 6.5–8.1)

## 2024-10-24 LAB — TYPE AND SCREEN
ABO/RH(D): O POS
Antibody Screen: NEGATIVE

## 2024-10-24 LAB — CBC
HCT: 49.7 % (ref 39.0–52.0)
Hemoglobin: 17.5 g/dL — ABNORMAL HIGH (ref 13.0–17.0)
MCH: 30.4 pg (ref 26.0–34.0)
MCHC: 35.2 g/dL (ref 30.0–36.0)
MCV: 86.4 fL (ref 80.0–100.0)
Platelets: 209 K/uL (ref 150–400)
RBC: 5.75 MIL/uL (ref 4.22–5.81)
RDW: 12 % (ref 11.5–15.5)
WBC: 5.5 K/uL (ref 4.0–10.5)
nRBC: 0 % (ref 0.0–0.2)

## 2024-10-24 LAB — LIPASE, BLOOD: Lipase: 23 U/L (ref 11–51)

## 2024-10-24 MED ORDER — IOHEXOL 350 MG/ML SOLN
75.0000 mL | Freq: Once | INTRAVENOUS | Status: AC | PRN
  Administered 2024-10-24: 75 mL via INTRAVENOUS

## 2024-10-24 NOTE — Discharge Instructions (Signed)
 Please go to the nearest ER for further workup and evaluation to rule out emergent conditions in your abdomen due to GI bleeding and abdominal pain.

## 2024-10-24 NOTE — Medical Student Note (Signed)
 [facility] Insurance Account Manager Note For educational purposes for Medical, PA and NP students only and not part of the legal medical record.   CSN: [id number] Arrival date & time: 10/24/24  1551      History   Chief Complaint Chief Complaint  Patient presents with   Abdominal Pain    HPI [name] is a 28 y.o. male.  Pt has a hx of IBS, GERD, and psychiatric disorder. Today he presents with Recurrent abd pain, bloating, rectal bleeding for over a year that waxes and wanes in severity and frequency. Pt reports his PCP tried to refer him to GI over the spring, but the GI specialist did not accept his insurance. He reports worsening of his symptoms over the last 2 weeks and acute worsening over the last 48 hours. He describes diffuse upper abd tightness that is sharp at times. He has associated bloating, N/V, constipation, steatorrhea, and hematochezia. Greasy, spicy, and tomato based foods make the symptoms worse. He has not been able to sleep well for the last 2 nights because of the pain. His pain today was an 8/10 severity that is now a 6/10 after he has taken some APAP. The rectal bleeding varies from bright red to dark maroon. His last BM was this morning and described as soft. It is noticed when he wipes, but also in the toilet. He has experienced some intermittent CP, ShOB, and fatigue.   The history is provided by the patient.  Abdominal Pain Associated symptoms: chest pain, constipation, fatigue, nausea, shortness of breath and vomiting   Associated symptoms: no chills, no cough, no diarrhea, no dysuria and no fever     Past Medical History:  Diagnosis Date   ADHD (attention deficit hyperactivity disorder)    Anxiety    Asthma    Constipation    Deliberate self-cutting    Depression    IBS (irritable bowel syndrome)    Irritable bowel syndrome    Post traumatic stress disorder (PTSD)    Suicide attempt (HCC)    Vision abnormalities    Pt  states he wears glasses    Patient Active Problem List   Diagnosis Date Noted   Alcohol use disorder 10/06/2022   Vitamin D  deficiency 10/11/2021   Nicotine  use disorder 10/06/2021   Severe episode of recurrent major depressive disorder, without psychotic features (HCC)    Major depression 04/06/2016   MDD (major depressive disorder), recurrent episode, severe (HCC) 11/14/2015   Substance abuse (HCC) 09/27/2015   MDD (major depressive disorder), recurrent severe, without psychosis (HCC) 02/01/2015   PTSD (post-traumatic stress disorder) 11/28/2013   ADHD (attention deficit hyperactivity disorder), combined type 10/16/2013   Oppositional defiant disorder 10/16/2013   Chronic constipation     History reviewed. No pertinent surgical history.     Home Medications    Prior to Admission medications   Medication Sig Start Date End Date Taking? Authorizing Provider  lamoTRIgine (LAMICTAL) 200 MG tablet Take 200 mg by mouth 2 (two) times daily.   Yes [provider]  gabapentin  (NEURONTIN ) 100 MG capsule Take 1 capsule (100 mg total) by mouth 3 (three) times daily. 10/10/22 11/09/22  Waymond Sieving, MD  ibuprofen  (ADVIL ) 600 MG tablet Take 1 tablet (600 mg total) by mouth every 6 (six) hours as needed. 05/02/24   Dean Clarity, MD  oxyCODONE -acetaminophen  (PERCOCET/ROXICET) 5-325 MG tablet Take 1 tablet by mouth every 6 (six) hours as needed for severe pain (pain score 7-10). Patient not  taking: Reported on 10/24/2024 05/02/24   Dean Clarity, MD  traZODone  (DESYREL ) 50 MG tablet Take 1 tablet (50 mg total) by mouth at bedtime as needed and may repeat dose one time if needed for sleep. Patient not taking: Reported on 02/14/2024 10/10/22 11/09/22  Tan, Daniel, MD  venlafaxine  XR (EFFEXOR -XR) 75 MG 24 hr capsule Take 1 capsule (75 mg total) by mouth daily with breakfast. Patient not taking: Reported on 10/24/2024 10/10/22 11/09/22  Waymond Sieving, MD    Family History Family History   Problem Relation Age of Onset   ADD / ADHD Brother    Hirschsprung's disease Neg Hx     Social History Social History   Tobacco Use   Smoking status: Some Days    Current packs/day: 1.50    Types: Cigarettes   Smokeless tobacco: Never  Vaping Use   Vaping status: Never Used  Substance Use Topics   Alcohol use: Not Currently    Comment: Pt states he drinks on occassion   Drug use: Not Currently    Types: Oxycodone , Marijuana    Comment: last used in 7th grade when he tried it/used oxycodone  last 2015     Allergies   Patient has no known allergies.   Review of Systems Review of Systems  Constitutional:  Positive for fatigue. Negative for chills and fever.  Respiratory:  Positive for shortness of breath. Negative for cough.   Cardiovascular:  Positive for chest pain.  Gastrointestinal:  Positive for abdominal distention, abdominal pain, anal bleeding, blood in stool, constipation, nausea and vomiting. Negative for diarrhea.  Genitourinary:  Negative for dysuria, frequency and urgency.  Neurological:  Negative for syncope, weakness and light-headedness.     Physical Exam Updated Vital Signs BP 130/85 (BP Location: Left Arm)   Pulse 87   Temp 98.7 F (37.1 C) (Oral)   Resp 16   SpO2 96%   Physical Exam Constitutional:      Appearance: He is obese. He is not ill-appearing.  Cardiovascular:     Rate and Rhythm: Normal rate and regular rhythm.     Heart sounds: Normal heart sounds.  Pulmonary:     Effort: Pulmonary effort is normal.     Breath sounds: Normal breath sounds.  Abdominal:     General: Bowel sounds are decreased. There is distension.     Palpations: Abdomen is soft. There is no mass or pulsatile mass.     Tenderness: There is abdominal tenderness in the right upper quadrant, epigastric area and left upper quadrant. There is no guarding or rebound. Negative signs include Murphy's sign, Rovsing's sign and McBurney's sign.  Skin:    General: Skin is  warm and dry.     Capillary Refill: Capillary refill takes less than 2 seconds.  Neurological:     Mental Status: He is alert.      ED Treatments / Results  Labs (all labs ordered are listed, but only abnormal results are displayed) Labs Reviewed - No data to display  EKG  Radiology No results found.  Procedures Procedures (including critical care time)  Medications Ordered in ED Medications - No data to display   Initial Impression / Assessment and Plan / ED Course  I have reviewed the triage vital signs and the nursing notes.  Pertinent labs & imaging results that were available during my care of the patient were reviewed by me and considered in my medical decision making (see chart for details).     Pt with acute  worsening of chronic abd pain and persistent rectal bleeding. Differential concerns for but not limited to gastritis, PUD, cholecystitis, bowel perforation, inflammatory bowel disease, appendicitis, intraabdominal infection, anemia from chronic GI blood loss. Given the limited resources in UC, I would like the patient to present to the ED for further work up and imaging. Discussed concerns with the patient and he is agreeable to present to [facility] ED at this time.   Final Clinical Impressions(s) / ED Diagnoses   Final diagnoses:  Right upper quadrant abdominal pain  Rectal bleeding    New Prescriptions New Prescriptions   No medications on file

## 2024-10-24 NOTE — ED Triage Notes (Signed)
 Pt arrives via POV. Was seen at [facility]. PT reports intermittent abdominal pain, nausea, bloating, and rectal bleeding for over a year. Pt reports symptoms returned/worsened over the past couple of days. PT is AxOx[facility].

## 2024-10-24 NOTE — ED Notes (Signed)
 Patient is being discharged from the Urgent Care and sent to the Emergency Department via private vehicle . Per provider, patient is in need of higher level of care due to abd pain with rectal bleeding. Patient is aware and verbalizes understanding of plan of care.  Vitals:   10/24/24 1728  BP: 130/85  Pulse: 87  Resp: 16  Temp: 98.7 F (37.1 C)  SpO2: 96%

## 2024-10-24 NOTE — ED Triage Notes (Signed)
 Patient reports that he has a history of IBS and states he has had issues x 1 year. Patient states he has seen his PCP and was referred to a specialist, but they were not covered by insurance.  Patient states for the past 2 weeks he has had increased abdominal pain, nausea and bloating and at it's worse in the past 24-48 hours.  Patient also reports that he ha had rectal bleeding with a mixture of bright red and dark red blood.  Patient states he has been taking Tylenol  and generic Pepto chewable tablets.

## 2024-10-24 NOTE — ED Provider Notes (Signed)
 [facility]    CSN: [id number] Arrival date & time: 10/24/24  1551      History   Chief Complaint Chief Complaint  Patient presents with   Abdominal Pain    HPI [facility] [name] is a 28 y.o. male.   [name] is a 29 y.o. male presenting for chief complaint of Abdominal Pain, rectal bleeding, and abdominal bloating. He has had symptoms for the last several months-1 year. Symptoms worsened 2 weeks ago with rectal bleeding daily with each stool, early satiety, abdominal bloating, and generalized abdominal pain. Blood from rectum is dark red. Reports intermittent nausea without vomiting and sometimes has acid reflux. Abdominal pain is diffuse to the abdomen, sometimes sharp in nature, and has kept him awake at night for the last 2 nights. He has had minimal sleep in the last 48 hours due to severe abdominal pain. Last BM was this morning with dark red blood streaking the stool. Greasy foods and red sauce/spicy foods makes symptoms worse. Denies recent constipation, diarrhea, dizziness., night sweats, weight loss without trying, and fever/chills.  He has never had a colonoscopy and denies recent antibiotic/steroid use. No history of Crohn's, UC, or diverticulitis.    Abdominal Pain   Past Medical History:  Diagnosis Date   ADHD (attention deficit hyperactivity disorder)    Anxiety    Asthma    Constipation    Deliberate self-cutting    Depression    IBS (irritable bowel syndrome)    Irritable bowel syndrome    Post traumatic stress disorder (PTSD)    Suicide attempt (HCC)    Vision abnormalities    Pt states he wears glasses    Patient Active Problem List   Diagnosis Date Noted   Alcohol use disorder 10/06/2022   Vitamin D  deficiency 10/11/2021   Nicotine  use disorder 10/06/2021   Severe episode of recurrent major depressive disorder, without psychotic features (HCC)    Major depression 04/06/2016   MDD (major depressive disorder), recurrent  episode, severe (HCC) 11/14/2015   Substance abuse (HCC) 09/27/2015   MDD (major depressive disorder), recurrent severe, without psychosis (HCC) 02/01/2015   PTSD (post-traumatic stress disorder) 11/28/2013   ADHD (attention deficit hyperactivity disorder), combined type 10/16/2013   Oppositional defiant disorder 10/16/2013   Chronic constipation     History reviewed. No pertinent surgical history.     Home Medications    Prior to Admission medications   Medication Sig Start Date End Date Taking? Authorizing Provider  lamoTRIgine (LAMICTAL) 200 MG tablet Take 200 mg by mouth 2 (two) times daily.   Yes [provider]  doxycycline (VIBRAMYCIN) 100 MG capsule Take 1 capsule (100 mg total) by mouth 2 (two) times daily. 10/25/24   Jarold Olam HERO, PA-C  gabapentin  (NEURONTIN ) 100 MG capsule Take 1 capsule (100 mg total) by mouth 3 (three) times daily. 10/10/22 11/09/22  Waymond Sieving, MD  ibuprofen  (ADVIL ) 600 MG tablet Take 1 tablet (600 mg total) by mouth every 6 (six) hours as needed. 05/02/24   Dean Clarity, MD  oxyCODONE -acetaminophen  (PERCOCET/ROXICET) 5-325 MG tablet Take 1 tablet by mouth every 6 (six) hours as needed for severe pain (pain score 7-10). Patient not taking: Reported on 10/24/2024 05/02/24   Dean Clarity, MD  traZODone  (DESYREL ) 50 MG tablet Take 1 tablet (50 mg total) by mouth at bedtime as needed and may repeat dose one time if needed for sleep. Patient not taking: Reported on 02/14/2024 10/10/22 11/09/22  Waymond Sieving, MD  venlafaxine   XR (EFFEXOR -XR) 75 MG 24 hr capsule Take 1 capsule (75 mg total) by mouth daily with breakfast. Patient not taking: Reported on 10/24/2024 10/10/22 11/09/22  Waymond Sieving, MD    Family History Family History  Problem Relation Age of Onset   ADD / ADHD Brother    Hirschsprung's disease Neg Hx     Social History Social History   Tobacco Use   Smoking status: Some Days    Current packs/day: 1.50    Types: Cigarettes    Smokeless tobacco: Never  Vaping Use   Vaping status: Never Used  Substance Use Topics   Alcohol use: Not Currently    Comment: Pt states he drinks on occassion   Drug use: Not Currently    Types: Oxycodone , Marijuana    Comment: last used in 7th grade when he tried it/used oxycodone  last 2015     Allergies   Patient has no known allergies.   Review of Systems Review of Systems  Gastrointestinal:  Positive for abdominal pain.  Per HPI   Physical Exam Triage Vital Signs ED Triage Vitals [10/24/24 1728]  Encounter Vitals Group     BP 130/85     Girls Systolic BP Percentile      Girls Diastolic BP Percentile      Boys Systolic BP Percentile      Boys Diastolic BP Percentile      Pulse Rate 87     Resp 16     Temp 98.7 F (37.1 C)     Temp Source Oral     SpO2 96 %     Weight      Height      Head Circumference      Peak Flow      Pain Score 8     Pain Loc      Pain Education      Exclude from Growth Chart    No data found.  Updated Vital Signs BP 130/85 (BP Location: Left Arm)   Pulse 87   Temp 98.7 F (37.1 C) (Oral)   Resp 16   SpO2 96%   Visual Acuity Right Eye Distance:   Left Eye Distance:   Bilateral Distance:    Right Eye Near:   Left Eye Near:    Bilateral Near:     Physical Exam Vitals and nursing note reviewed.  Constitutional:      Appearance: He is not ill-appearing or toxic-appearing.  HENT:     Head: Normocephalic and atraumatic.     Right Ear: Hearing and external ear normal.     Left Ear: Hearing and external ear normal.     Nose: Nose normal.     Mouth/Throat:     Lips: Pink.  Eyes:     General: Lids are normal. Vision grossly intact. Gaze aligned appropriately.     Extraocular Movements: Extraocular movements intact.     Conjunctiva/sclera: Conjunctivae normal.  Pulmonary:     Effort: Pulmonary effort is normal.  Abdominal:     General: Bowel sounds are normal.     Palpations: Abdomen is soft.     Tenderness: There  is abdominal tenderness in the right upper quadrant. There is guarding. There is no right CVA tenderness, left CVA tenderness or rebound. Negative signs include Murphy's sign, Rovsing's sign, McBurney's sign and obturator sign.  Musculoskeletal:     Cervical back: Neck supple.  Skin:    General: Skin is warm and dry.     Capillary Refill:  Capillary refill takes less than 2 seconds.     Findings: No rash.  Neurological:     General: No focal deficit present.     Mental Status: He is alert and oriented to person, place, and time. Mental status is at baseline.     Cranial Nerves: No dysarthria or facial asymmetry.  Psychiatric:        Mood and Affect: Mood normal.        Speech: Speech normal.        Behavior: Behavior normal.        Thought Content: Thought content normal.        Judgment: Judgment normal.      UC Treatments / Results  Labs (all labs ordered are listed, but only abnormal results are displayed) Labs Reviewed - No data to display  EKG   Radiology No results found.  Procedures Procedures (including critical care time)  Medications Ordered in UC Medications - No data to display  Initial Impression / Assessment and Plan / UC Course  I have reviewed the triage vital signs and the nursing notes.  Pertinent labs & imaging results that were available during my care of the patient were reviewed by me and considered in my medical decision making (see chart for details).   1. Right upper quadrant abdominal pain, rectal bleeding Patient with diffuse tenderness to palpation over the abdomen with RUQ guarding on exam. Persistent worsening rectal bleeding for several months-1 year.  Concern for acute GI bleeding, PUD, hemorrhoid, colitis, acute abdomen, etc.  Recommend further workup and evaluation in the ER with advanced imaging that we are not capable of performing here in the urgent care.  Patient is agreeable with plan, stable for discharge to ER via private car, all  questions answered, he agrees to proceed to the ER  for further workup.   Final Clinical Impressions(s) / UC Diagnoses   Final diagnoses:  Right upper quadrant abdominal pain  Rectal bleeding     Discharge Instructions      Please go to the nearest ER for further workup and evaluation to rule out emergent conditions in your abdomen due to GI bleeding and abdominal pain.      ED Prescriptions   None    PDMP not reviewed this encounter.   Enedelia Dorna HERO, [place] 10/30/24 2030

## 2024-10-24 NOTE — ED Provider Triage Note (Signed)
 Emergency Medicine Provider Triage Evaluation Note  [facility] , a 28 y.o. male  was evaluated in triage.  Pt complains of abd pain.  Review of Systems  Positive: Negative:   Physical Exam  BP (!) 140/92 (BP Location: Right Arm)   Pulse 77   Temp 98.6 F (37 C)   Resp 19   Wt 92.5 kg   SpO2 100%   BMI 33.93 kg/m  Gen:   Awake, no distress   Resp:  Normal effort  MSK:   Moves extremities without difficulty  Other:    Medical Decision Making  Medically screening exam initiated at 8:14 PM.  Appropriate orders placed.  [facility] was informed that the remainder of the evaluation will be completed by another provider, this initial triage assessment does not replace that evaluation, and the importance of remaining in the ED until their evaluation is complete.  Remote h/o IBS (pediatric), intermittent abd pain. He reports he is now seeing blood in his BM's, sometimes appears dark. Periumbilical AP, bloating. Seen at [facility] today and sent for further evaluation.   Odell Balls, PA-C 10/24/24 2015

## 2024-10-25 ENCOUNTER — Telehealth: Payer: Self-pay

## 2024-10-25 MED ORDER — SODIUM CHLORIDE 0.9 % IV SOLN
1.0000 g | Freq: Once | INTRAVENOUS | Status: AC
  Administered 2024-10-25: 1 g via INTRAVENOUS
  Filled 2024-10-25: qty 10

## 2024-10-25 MED ORDER — DOXYCYCLINE HYCLATE 100 MG PO CAPS: 100.0000 mg | ORAL_CAPSULE | Freq: Two times a day (BID) | ORAL | 0 refills | Status: AC

## 2024-10-25 MED ORDER — CEFTRIAXONE SODIUM 500 MG IJ SOLR: 500.0000 mg | Freq: Once | INTRAMUSCULAR | Status: DC

## 2024-10-25 MED ORDER — DOXYCYCLINE HYCLATE 100 MG PO TABS
100.0000 mg | ORAL_TABLET | Freq: Once | ORAL | Status: AC
  Administered 2024-10-25: 100 mg via ORAL
  Filled 2024-10-25: qty 1

## 2024-10-25 NOTE — Discharge Instructions (Addendum)
 Take the prescribed medication as directed. Follow-up with GI-- I would call in the morning to get this set up. Return to the ED for new or worsening symptoms.

## 2024-10-25 NOTE — ED Provider Notes (Signed)
 [facility] Provider Note   CSN: [id number] Arrival date & time: 10/24/24  1825     Patient presents with: Rectal Bleeding and Abdominal Pain   [name] is a 28 y.o. male.   The history is provided by the patient and medical records.  Rectal Bleeding Associated symptoms: abdominal pain   Abdominal Pain Associated symptoms: hematochezia    28 year old male presenting to the ED with rectal pain and bleeding.  He reports has been having ongoing issues for about a year now.  He was evaluated by PCP and sent to GI, however could not afford the out-of-pocket co-pay for was never seen.  He has continued to have issues, seems to be worsening recently.  Still able to have bowel movements, sometimes strains and has blood when wiping.  He denies any blood intermixed in the stool.  Often has some bloating after eating, especially starchy foods such as pasta or greasy items.  Doesn't eat a lot of dairy.  He has no prior hx of inflammatory bowel disease but reports his PCP was concerned about this.  No family history.  He denies any recent rectal trauma, states there was a incident about a year ago but had testing and no further since that time.    Prior to Admission medications   Medication Sig Start Date End Date Taking? Authorizing Provider  gabapentin  (NEURONTIN ) 100 MG capsule Take 1 capsule (100 mg total) by mouth 3 (three) times daily. 10/10/22 11/09/22  Waymond Sieving, MD  ibuprofen  (ADVIL ) 600 MG tablet Take 1 tablet (600 mg total) by mouth every 6 (six) hours as needed. 05/02/24   Haviland, Julie, MD  lamoTRIgine (LAMICTAL) 200 MG tablet Take 200 mg by mouth 2 (two) times daily.    [provider]  oxyCODONE -acetaminophen  (PERCOCET/ROXICET) 5-325 MG tablet Take 1 tablet by mouth every 6 (six) hours as needed for severe pain (pain score 7-10). Patient not taking: Reported on 10/24/2024 05/02/24   Dean Clarity, MD  traZODone   (DESYREL ) 50 MG tablet Take 1 tablet (50 mg total) by mouth at bedtime as needed and may repeat dose one time if needed for sleep. Patient not taking: Reported on 02/14/2024 10/10/22 11/09/22  Waymond Sieving, MD  venlafaxine  XR (EFFEXOR -XR) 75 MG 24 hr capsule Take 1 capsule (75 mg total) by mouth daily with breakfast. Patient not taking: Reported on 10/24/2024 10/10/22 11/09/22  Waymond Sieving, MD    Allergies: Patient has no known allergies.    Review of Systems  Gastrointestinal:  Positive for abdominal pain and hematochezia.  All other systems reviewed and are negative.   Updated Vital Signs BP (!) 140/95 (BP Location: Right Arm)   Pulse 93   Temp 98 F (36.7 C) (Oral)   Resp 15   Ht 5' 5 (1.651 m)   Wt 117.9 kg   SpO2 97%   BMI 43.27 kg/m   Physical Exam Vitals and nursing note reviewed.  Constitutional:      Appearance: He is well-developed.  HENT:     Head: Normocephalic and atraumatic.  Eyes:     Conjunctiva/sclera: Conjunctivae normal.     Pupils: Pupils are equal, round, and reactive to light.  Cardiovascular:     Rate and Rhythm: Normal rate and regular rhythm.     Heart sounds: Normal heart sounds.  Pulmonary:     Effort: Pulmonary effort is normal.     Breath sounds: Normal breath sounds.  Abdominal:  General: Bowel sounds are normal.     Palpations: Abdomen is soft.     Tenderness: There is no abdominal tenderness. There is no guarding or rebound.     Comments: Soft, non-tender, no peritoneal signs  Musculoskeletal:        General: Normal range of motion.     Cervical back: Normal range of motion.  Skin:    General: Skin is warm and dry.  Neurological:     Mental Status: He is alert and oriented to person, place, and time.     (all labs ordered are listed, but only abnormal results are displayed) Labs Reviewed  CBC - Abnormal; Notable for the following components:      Result Value   Hemoglobin 17.5 (*)    All other components within normal  limits  URINALYSIS, ROUTINE W REFLEX MICROSCOPIC - Abnormal; Notable for the following components:   Ketones, ur 5 (*)    All other components within normal limits  COMPREHENSIVE METABOLIC PANEL WITH GFR  LIPASE, BLOOD  POC OCCULT BLOOD, ED  TYPE AND SCREEN    EKG: None  Radiology: CT ABDOMEN PELVIS W CONTRAST Result Date: 10/24/2024 EXAM: CT ABDOMEN AND PELVIS WITH CONTRAST 10/24/2024 09:09:51 PM TECHNIQUE: CT of the abdomen and pelvis was performed with the administration of 75 mL of iohexol (OMNIPAQUE) 350 MG/ML injection. Multiplanar reformatted images are provided for review. Automated exposure control, iterative reconstruction, and/or weight-based adjustment of the mA/kV was utilized to reduce the radiation dose to as low as reasonably achievable. COMPARISON: None available. CLINICAL HISTORY: Abdominal pain, acute, nonlocalized. Rectal Bleeding; Abdominal Pain. FINDINGS: LOWER CHEST: On series 4, there is a 3 mm right lower lobe pleural-based nodule on image 3, and a 3 mm left lower lobe pleural based nodule and a 2 mm lingular nodule both on image 4. The lung bases are otherwise clear. The cardiac size is normal. LIVER: The liver is 20 cm in length and mildly steatotic without mass. GALLBLADDER AND BILE DUCTS: Gallbladder is unremarkable. No biliary ductal dilatation. SPLEEN: No acute abnormality. PANCREAS: No acute abnormality. ADRENAL GLANDS: No acute abnormality. KIDNEYS, URETERS AND BLADDER: No stones in the kidneys or ureters. No hydronephrosis. No perinephric or periureteral stranding. Urinary bladder is unremarkable. GI AND BOWEL: The contracted stomach is unremarkable. The unopacified small bowel is normal in caliber without wall thickening or inflammation. The appendix is normal. There is a normal colon wall thickness until the rectum, which demonstrates thickened folds, adjacent trace stranding changes concerning for proctitis whether infectious or inflammatory. Infiltrating disease  is a possibility but rare at this age. Follow up as indicated. PERITONEUM AND RETROPERITONEUM: No ascites. No free air. VASCULATURE: Aorta is normal in caliber. LYMPH NODES: No lymphadenopathy. REPRODUCTIVE ORGANS: No acute abnormality. BONES AND SOFT TISSUES: There is a mild upper endplate compression deformity of the L3 vertebral body but no more than about 10 to 15% loss of vertebral body height. This has a chronic appearance. There is an upper endplate Schmorl's node at this level also. Superior and inferior endplate Schmorl's nodes are also noted of the T10 body. No acute or other significant osseous findings are seen. Bone islands are noted in the left ischial tuberosity and proximal right femur. No focal soft tissue abnormality. IMPRESSION: 1. Rectal wall thickening with adjacent trace stranding, most consistent with proctitis; infiltrating disease possible but unlikely at this age. Recommend appropriate clinical and/or endoscopic evaluation. 2. Multiple small pulmonary nodules up to 3 mm. For an incomplete chest CT,  per Fleischner Society Guidelines, no routine follow-up is recommended; if the patient is high risk, an optional non-contrast chest CT at 12 months may be considered. 3. Chronic appearing mild L3 superior endplate compression deformity without acute osseous abnormality. . Electronically signed by: Francis Quam MD 10/24/2024 09:29 PM EDT RP Workstation: [id number]     Procedures   Medications Ordered in the ED  cefTRIAXone (ROCEPHIN) injection 500 mg (has no administration in time range)  doxycycline (VIBRA-TABS) tablet 100 mg (has no administration in time range)  iohexol (OMNIPAQUE) 350 MG/ML injection 75 mL (75 mLs Intravenous Contrast Given 10/24/24 2110)                                    Medical Decision Making Amount and/or Complexity of Data Reviewed Labs: ordered. Radiology: ordered and independent interpretation performed. ECG/medicine tests: ordered and independent  interpretation performed.  Risk Prescription drug management.   28 year old male presenting to the ED with 1 year of intermittent abdominal pain and some rectal bleeding.  Was previously referred to GI but never had follow-up.  Denies any fever or chills.  He is afebrile and nontoxic in appearance here.  His abdomen is soft and nontender, no peritoneal signs.  Labs are reassuring without leukocytosis or electrolyte derangement.  Stable hemoglobin.  CT with findings of proctitis.  No abscess formation.  Given chronicity of his symptoms, I suspect he may have some type of inflammatory bowel disease such as Crohn's disease or ulcerative colitis.  He states his PCP was worried about this as well.  He denies any recent rectal trauma/anal sex.  Has had prior STD testing which was normal.  Will given dose of rocephin here, plan to d/c home with doxycycline.  Will refer to GI locally for follow-up.  Can return here for new concerns.  Final diagnoses:  Proctitis    ED Discharge Orders          Ordered    doxycycline (VIBRAMYCIN) 100 MG capsule  2 times daily        10/25/24 0356               Jarold Olam HERO, PA-C 10/25/24 0445    Griselda Norris, MD 10/25/24 [phone]

## 2024-10-25 NOTE — Telephone Encounter (Signed)
 Patient called in to state he was seen in the ED and was supposed to have a GI referral to [facility]. He called them to make a appt and they told him  they needed to get  a referral. Messaged Dr Griselda and asked him to call his PCP office as well to see if they could send referral. He will call GI to see if things got sent tomorrow or Monday.

## 2024-12-12 ENCOUNTER — Ambulatory Visit: Payer: MEDICAID | Admitting: Physician Assistant

## 2025-01-13 NOTE — Progress Notes (Unsigned)
 "     Ellouise Console, PA-C [address] [place], [place]  [address] Phone: [phone]   Gastroenterology Consultation  Referring Provider:     Celestia Harder, NP Primary Care Physician:  Freddrick, No Primary Gastroenterologist:  Ellouise Console, PA-C / *** Reason for Consultation:     Rectal bleeding        HPI:   Discussed the use of AI scribe software for clinical note transcription with the patient, who gave verbal consent to proceed.  New patient.  Here to evaluate rectal bleeding.  History of Present Illness      Past Medical History:  Diagnosis Date   ADHD (attention deficit hyperactivity disorder)    Anxiety    Asthma    Constipation    Deliberate self-cutting    Depression    IBS (irritable bowel syndrome)    Irritable bowel syndrome    Post traumatic stress disorder (PTSD)    Suicide attempt (HCC)    Vision abnormalities    Pt states he wears glasses    No past surgical history on file.  Prior to Admission medications  Medication Sig Start Date End Date Taking? Authorizing Provider  doxycycline  (VIBRAMYCIN ) 100 MG capsule Take 1 capsule (100 mg total) by mouth 2 (two) times daily. 10/25/24   Jarold Olam HERO, PA-C  gabapentin  (NEURONTIN ) 100 MG capsule Take 1 capsule (100 mg total) by mouth 3 (three) times daily. 10/10/22 11/09/22  Waymond Sieving, MD  ibuprofen  (ADVIL ) 600 MG tablet Take 1 tablet (600 mg total) by mouth every 6 (six) hours as needed. 05/02/24   Haviland, Julie, MD  lamoTRIgine (LAMICTAL) 200 MG tablet Take 200 mg by mouth 2 (two) times daily.    [provider]  oxyCODONE -acetaminophen  (PERCOCET/ROXICET) 5-325 MG tablet Take 1 tablet by mouth every 6 (six) hours as needed for severe pain (pain score 7-10). Patient not taking: Reported on 10/24/2024 05/02/24   Dean Clarity, MD  traZODone  (DESYREL ) 50 MG tablet Take 1 tablet (50 mg total) by mouth at bedtime as needed and may repeat dose one time if needed for sleep. Patient not taking:  Reported on 02/14/2024 10/10/22 11/09/22  Waymond Sieving, MD  venlafaxine  XR (EFFEXOR -XR) 75 MG 24 hr capsule Take 1 capsule (75 mg total) by mouth daily with breakfast. Patient not taking: Reported on 10/24/2024 10/10/22 11/09/22  Waymond Sieving, MD    Family History  Problem Relation Age of Onset   ADD / ADHD Brother    Hirschsprung's disease Neg Hx      Social History[1]  Allergies as of 01/16/2025   (No Known Allergies)    Review of Systems:    All systems reviewed and negative except where noted in HPI.   Physical Exam:  There were no vitals taken for this visit. No LMP for male patient.  General:   Alert,  Well-developed, well-nourished, pleasant and cooperative in NAD Lungs:  Respirations even and unlabored.  Clear throughout to auscultation.   No wheezes, crackles, or rhonchi. No acute distress. Heart:  Regular rate and rhythm; no murmurs, clicks, rubs, or gallops. Abdomen:  Normal bowel sounds.  No bruits.  Soft, and non-distended without masses, hepatosplenomegaly or hernias noted.  No Tenderness.  No guarding or rebound tenderness.    Neurologic:  Alert and oriented x3;  grossly normal neurologically. Psych:  Alert and cooperative. Normal mood and affect.   Imaging Studies: No results found.  Labs: CBC    Component Value Date/Time   WBC 5.5  10/24/2024 1945   RBC 5.75 10/24/2024 1945   HGB 17.5 (H) 10/24/2024 1945   HCT 49.7 10/24/2024 1945   PLT 209 10/24/2024 1945   MCV 86.4 10/24/2024 1945    CMP     Component Value Date/Time   NA 138 10/24/2024 1945   K 4.0 10/24/2024 1945   CL 102 10/24/2024 1945   CO2 26 10/24/2024 1945   GLUCOSE 80 10/24/2024 1945   BUN 8 10/24/2024 1945   CREATININE 0.98 10/24/2024 1945   CALCIUM  9.7 10/24/2024 1945   PROT 7.7 10/24/2024 1945   ALBUMIN 4.6 10/24/2024 1945   AST 29 10/24/2024 1945   ALT 36 10/24/2024 1945   ALKPHOS 76 10/24/2024 1945   BILITOT 0.9 10/24/2024 1945   GFRNONAA >60 10/24/2024 1945   GFRAA NOT  CALCULATED 05/08/2016 1846    Assessment and Plan:   [name] is a 29 y.o. y/o male has been referred for ***  Assessment and Plan Assessment & Plan       Follow up ***  Ellouise Console, PA-C      [1]  Social History Tobacco Use   Smoking status: Some Days    Current packs/day: 1.50    Types: Cigarettes   Smokeless tobacco: Never  Vaping Use   Vaping status: Never Used  Substance Use Topics   Alcohol use: Not Currently    Comment: Pt states he drinks on occassion   Drug use: Not Currently    Types: Oxycodone , Marijuana    Comment: last used in 7th grade when he tried it/used oxycodone  last 2015   "

## 2025-01-16 ENCOUNTER — Ambulatory Visit: Payer: MEDICAID | Admitting: Physician Assistant

## 2025-02-13 ENCOUNTER — Ambulatory Visit: Payer: MEDICAID | Admitting: Physician Assistant
